# Patient Record
Sex: Female | Born: 1960
Health system: Southern US, Community
[De-identification: ages and names within clinical notes are randomized; demographics above are authoritative.]

## PROBLEM LIST (undated history)

## (undated) DIAGNOSIS — I341 Nonrheumatic mitral (valve) prolapse: Secondary | ICD-10-CM

## (undated) DIAGNOSIS — M810 Age-related osteoporosis without current pathological fracture: Secondary | ICD-10-CM

## (undated) DIAGNOSIS — R42 Dizziness and giddiness: Secondary | ICD-10-CM

## (undated) DIAGNOSIS — R87619 Unspecified abnormal cytological findings in specimens from cervix uteri: Secondary | ICD-10-CM

## (undated) DIAGNOSIS — K219 Gastro-esophageal reflux disease without esophagitis: Secondary | ICD-10-CM

## (undated) HISTORY — DX: Unspecified abnormal cytological findings in specimens from cervix uteri: R87.619

## (undated) HISTORY — DX: Dizziness and giddiness: R42

## (undated) HISTORY — DX: Gastro-esophageal reflux disease without esophagitis: K21.9

## (undated) HISTORY — DX: Nonrheumatic mitral (valve) prolapse: I34.1

## (undated) HISTORY — DX: Age-related osteoporosis without current pathological fracture: M81.0

---

## 1996-07-04 DIAGNOSIS — R87619 Unspecified abnormal cytological findings in specimens from cervix uteri: Secondary | ICD-10-CM

## 1996-07-04 HISTORY — DX: Unspecified abnormal cytological findings in specimens from cervix uteri: R87.619

## 1996-07-04 HISTORY — PX: COLPOSCOPY: SHX161

## 1996-07-04 HISTORY — PX: CERVICAL BIOPSY  W/ LOOP ELECTRODE EXCISION: SUR135

## 1998-03-03 ENCOUNTER — Other Ambulatory Visit: Admission: RE | Admit: 1998-03-03 | Discharge: 1998-03-03 | Payer: Self-pay | Admitting: Obstetrics and Gynecology

## 1998-07-18 ENCOUNTER — Other Ambulatory Visit: Admission: RE | Admit: 1998-07-18 | Discharge: 1998-07-18 | Payer: Self-pay | Admitting: Obstetrics and Gynecology

## 1999-01-16 ENCOUNTER — Other Ambulatory Visit: Admission: RE | Admit: 1999-01-16 | Discharge: 1999-01-16 | Payer: Self-pay | Admitting: Obstetrics and Gynecology

## 1999-12-25 ENCOUNTER — Other Ambulatory Visit: Admission: RE | Admit: 1999-12-25 | Discharge: 1999-12-25 | Payer: Self-pay | Admitting: Obstetrics and Gynecology

## 2000-06-21 ENCOUNTER — Other Ambulatory Visit: Admission: RE | Admit: 2000-06-21 | Discharge: 2000-06-21 | Payer: Self-pay | Admitting: Obstetrics and Gynecology

## 2000-12-20 ENCOUNTER — Encounter: Admission: RE | Admit: 2000-12-20 | Discharge: 2000-12-20 | Payer: Self-pay | Admitting: Family Medicine

## 2000-12-20 ENCOUNTER — Encounter: Payer: Self-pay | Admitting: Family Medicine

## 2000-12-25 ENCOUNTER — Other Ambulatory Visit: Admission: RE | Admit: 2000-12-25 | Discharge: 2000-12-25 | Payer: Self-pay | Admitting: Obstetrics and Gynecology

## 2001-10-08 ENCOUNTER — Other Ambulatory Visit: Admission: RE | Admit: 2001-10-08 | Discharge: 2001-10-08 | Payer: Self-pay | Admitting: Obstetrics and Gynecology

## 2002-02-17 ENCOUNTER — Other Ambulatory Visit: Admission: RE | Admit: 2002-02-17 | Discharge: 2002-02-17 | Payer: Self-pay | Admitting: *Deleted

## 2002-08-25 ENCOUNTER — Other Ambulatory Visit: Admission: RE | Admit: 2002-08-25 | Discharge: 2002-08-25 | Payer: Self-pay | Admitting: Obstetrics and Gynecology

## 2002-11-04 ENCOUNTER — Encounter: Admission: RE | Admit: 2002-11-04 | Discharge: 2002-11-04 | Payer: Self-pay | Admitting: Obstetrics and Gynecology

## 2002-11-04 ENCOUNTER — Encounter: Payer: Self-pay | Admitting: Obstetrics and Gynecology

## 2003-02-02 HISTORY — PX: HYSTEROSCOPY: SHX211

## 2003-03-02 ENCOUNTER — Ambulatory Visit (HOSPITAL_COMMUNITY): Admission: RE | Admit: 2003-03-02 | Discharge: 2003-03-02 | Payer: Self-pay | Admitting: Obstetrics and Gynecology

## 2003-03-02 ENCOUNTER — Encounter (INDEPENDENT_AMBULATORY_CARE_PROVIDER_SITE_OTHER): Payer: Self-pay | Admitting: *Deleted

## 2003-06-14 ENCOUNTER — Other Ambulatory Visit: Admission: RE | Admit: 2003-06-14 | Discharge: 2003-06-14 | Payer: Self-pay | Admitting: Obstetrics and Gynecology

## 2003-10-11 ENCOUNTER — Observation Stay (HOSPITAL_COMMUNITY): Admission: EM | Admit: 2003-10-11 | Discharge: 2003-10-12 | Payer: Self-pay | Admitting: Emergency Medicine

## 2004-05-25 ENCOUNTER — Encounter: Admission: RE | Admit: 2004-05-25 | Discharge: 2004-05-25 | Payer: Self-pay | Admitting: Obstetrics and Gynecology

## 2004-06-15 ENCOUNTER — Other Ambulatory Visit: Admission: RE | Admit: 2004-06-15 | Discharge: 2004-06-15 | Payer: Self-pay | Admitting: Obstetrics and Gynecology

## 2005-06-18 ENCOUNTER — Other Ambulatory Visit: Admission: RE | Admit: 2005-06-18 | Discharge: 2005-06-18 | Payer: Self-pay | Admitting: Obstetrics and Gynecology

## 2005-08-20 ENCOUNTER — Encounter: Admission: RE | Admit: 2005-08-20 | Discharge: 2005-08-20 | Payer: Self-pay | Admitting: Obstetrics and Gynecology

## 2006-07-18 ENCOUNTER — Other Ambulatory Visit: Admission: RE | Admit: 2006-07-18 | Discharge: 2006-07-18 | Payer: Self-pay | Admitting: Obstetrics & Gynecology

## 2006-08-18 ENCOUNTER — Encounter: Admission: RE | Admit: 2006-08-18 | Discharge: 2006-08-18 | Payer: Self-pay | Admitting: Specialist

## 2006-08-22 ENCOUNTER — Encounter: Admission: RE | Admit: 2006-08-22 | Discharge: 2006-08-22 | Payer: Self-pay | Admitting: Obstetrics and Gynecology

## 2007-08-04 ENCOUNTER — Other Ambulatory Visit: Admission: RE | Admit: 2007-08-04 | Discharge: 2007-08-04 | Payer: Self-pay | Admitting: Obstetrics & Gynecology

## 2008-04-28 ENCOUNTER — Encounter: Admission: RE | Admit: 2008-04-28 | Discharge: 2008-04-28 | Payer: Self-pay | Admitting: Obstetrics and Gynecology

## 2008-08-23 ENCOUNTER — Other Ambulatory Visit: Admission: RE | Admit: 2008-08-23 | Discharge: 2008-08-23 | Payer: Self-pay | Admitting: Obstetrics & Gynecology

## 2009-09-23 ENCOUNTER — Encounter: Admission: RE | Admit: 2009-09-23 | Discharge: 2009-09-23 | Payer: Self-pay | Admitting: Obstetrics and Gynecology

## 2010-01-24 ENCOUNTER — Observation Stay: Payer: Self-pay | Admitting: Internal Medicine

## 2010-02-06 ENCOUNTER — Ambulatory Visit: Payer: Self-pay | Admitting: Internal Medicine

## 2010-02-09 ENCOUNTER — Ambulatory Visit: Payer: Self-pay | Admitting: Internal Medicine

## 2010-02-12 ENCOUNTER — Emergency Department: Payer: Self-pay | Admitting: Emergency Medicine

## 2010-02-17 ENCOUNTER — Ambulatory Visit: Payer: Self-pay | Admitting: Unknown Physician Specialty

## 2010-10-26 ENCOUNTER — Other Ambulatory Visit: Payer: Self-pay | Admitting: Obstetrics and Gynecology

## 2010-10-26 DIAGNOSIS — Z1231 Encounter for screening mammogram for malignant neoplasm of breast: Secondary | ICD-10-CM

## 2010-11-06 ENCOUNTER — Ambulatory Visit
Admission: RE | Admit: 2010-11-06 | Discharge: 2010-11-06 | Disposition: A | Discharge status: Home / Self Care | Payer: Self-pay | Source: Ambulatory Visit | Attending: Obstetrics and Gynecology | Admitting: Obstetrics and Gynecology

## 2010-11-06 DIAGNOSIS — Z1231 Encounter for screening mammogram for malignant neoplasm of breast: Secondary | ICD-10-CM

## 2011-01-19 NOTE — Discharge Summary (Signed)
NAME:  Madison Evans, Madison Evans                          ACCOUNT NO.:  [id number]   MEDICAL RECORD NO.:  [id number]                   PATIENT TYPE:  OBV   LOCATION:  6529                                 FACILITY:  [facility]   PHYSICIAN:  Nicki Guadalajara, M.D.                  DATE OF BIRTH:  1961-06-19   DATE OF ADMISSION:  10/11/2003  DATE OF DISCHARGE:  10/12/2003                                 DISCHARGE SUMMARY   ADMISSION DIAGNOSES:  1. Atypical chest pain.  2. Possible sleep apnea.   BRIEF HISTORY:  The patient is a 50 year old white female with no prior  cardiac history and no past medical history who presents with left arm  discomfort.  She was initially seen by Dr. Talmadge Coventry and was  transferred to the ER and evaluated by Dr. Tresa Endo.  She notes that she was  sitting at her desk at work, she had no diaphoresis but developed some left  arm numbness and cold tingling feelings.  She felt presyncopal and light-  headed and had some mild shortness of breath.  There was no chest  discomfort, just some mild shortness of breath.  She was transferred to the  ER and EKG there showed sinus bradycardia, there was left axis deviation and  Q waves in V1 through 3.  There were no other STT wave changes.  Point of  care markers were obtained and these were all negative.  She was admitted  for further evaluation.  Troponins and CKs were also negative.  The  following morning, she had had no further chest pain, no shortness of  breath, left arm numbness had improved.   Her labs showed a hemoglobin of 14.2 and hematocrit of 41.5, white count 6.7  and platelets 227,000.  Labs showed a sodium of 138, potassium 3.4, chloride  104, CO2 29, BUN 10, creatinine 0.7, glucose 90.   She was seen and evaluated by Dr. Tresa Endo the following morning.  It was his  impression that she probably had a form of atypical chest pain.  A cervical  series has been obtained but we do not have that report yet and we are going  to check on it.  Dr. Tresa Endo plans to discharge the patient home today.  We  have set her up for a Cardiolite stress test and 2-D echo on Tuesday,  October 19, 2003 at 12:45 a.m.  The patient also noted that she had sleep  apnea and this was confirmed by her husband with longer periods of sleep  apnea noted at home.  Because of this, she is also being scheduled for a  sleep apnea study.  She will return to see Dr. Tresa Endo about 1 week after her  Cardiolite and 2-D echo are completed.  She was given Aleve to use as  directed for pain and we placed her on an 81 mg baby aspirin daily until  she  returns.   CONDITION ON DISCHARGE:  Improved.   DISCHARGE DIAGNOSES:  1. Atypical angina.  2. Possible sleep apnea.      Eber Hong, P.A.                 Nicki Guadalajara, M.D.    WDJ/MEDQ  D:  10/12/2003  T:  10/12/2003  Job:  [id number]   cc:   Talmadge Coventry, M.D.  [address]  [place]  [place] [address]  Fax: [phone]

## 2011-01-19 NOTE — Op Note (Signed)
NAME:  SAMANTHAN, DUGO                          ACCOUNT NO.:  [id number]   MEDICAL RECORD NO.:  [id number]                   PATIENT TYPE:  AMB   LOCATION:  SDC                                  FACILITY:  [facility]   PHYSICIAN:  Laqueta Linden, M.D.                 DATE OF BIRTH:  [birth year]   DATE OF PROCEDURE:  03/02/2003  DATE OF DISCHARGE:                                 OPERATIVE REPORT   PREOPERATIVE DIAGNOSIS:  Breakthrough bleeding, endocervical polyp.   POSTOPERATIVE DIAGNOSIS:  Breakthrough bleeding, endocervical polyp.   OPERATION PERFORMED:  Hysteroscopic resection of endocervical polyp,  endometrial curettage.   SURGEON:  Laqueta Linden, M.D.   ANESTHESIA:  MAC sedation with paracervical block.   ESTIMATED BLOOD LOSS:  Less than 50mL.   SORBITOL INTAKE:  .   COMPLICATIONS:  None.   INDICATIONS FOR PROCEDURE:  The patient is a 50 year old gravida 2, para 2,  married white female on birth control pills to control her cycles and acne.  She had a new onset of breakthrough bleeding for the past three cycles  associated with worsening dysmenorrhea.  Pelvic ultrasound was within normal  limits with no significant findings in the endometrial cavity on  sonohysterogram; however, there did appear to be a 1.1 cm  cervical/endocervical polyp which was felt to likely be responsible for her  symptoms.  She has seen the informed consent film, voiced her understanding  and acceptance of risks, benefits, alternatives and complications of the  hysteroscopic evaluation and resection and agrees to proceed.  She  understands that this might not provide a definitive solution for all of her  problems and that she will likely need to continue on hormonal control for  cycle regulation.  She has voiced her understanding and acceptance of all  risks and agrees to proceed.   DESCRIPTION OF PROCEDURE:  The patient was taken to the operating room and  after proper identification and consent  were ascertained, she was placed on  the operating table in supine position.  After IV sedation was accomplished,  she was placed in the [facility] stirrups and the perineum and vagina were  prepped and draped in a routine sterile fashion.  A transurethral Foley  catheter was placed which was removed at the conclusion of the procedure.  Bimanual examination confirmed an anterior, mobile, parous size uterus.  Speculum was placed in the vagina and the cervix was grasped with a single  toothed tenaculum.  A paracervical block utilizing 10mL of 1% plain  lidocaine was then placed circumferentially.  The endometrial cavity sounded  to 10 cm.  The internal os was gently dilated to a #33 Pratt dilator.  The  resectoscope with continuous Sorbitol infusion was then inserted under  direct vision.  The uterine fundus was free of lesions.  It was slightly  shaggy in appearance consistent with the patient's current bleeding state  but there were no focal lesions identified.  Upon backing up the scope, the  lower uterine segment appeared free of lesions as well.  Upon removing the  scope almost  to the level of the external os, a large broad based polyp  popped into view which appeared to be emanating from the left lateral aspect  of the cervix and endocervix.  The single loop was placed on routine  settings and this was resected in multiple passes.  Hemostasis was  excellent.  There were no other focal lesions identified.  Photographs were  taken.  The hysteroscope was then removed.  Sharp curettage of the  endometrial cavity revealed a minimal amount of additional tissue which was  sent separately.  All instruments were removed.  The tenaculum site was  hemostatic.  The estimated blood loss was less than 50mL.  Sorbitol net  intake .   COMPLICATIONS:  None.   The patient was stable and awake on transfer to the recovery room.  She  received Toradol 30 mg IV and 30 mg IM.  She was told to take Advil  or Aleve  as needed at home for cramping.  She is to finish her birth control pills as  directed and then resume cyclic birth control pill use.  She is to follow up  in the office in two to three weeks' time and see me for excessive pain,  fever, bleeding or other concerns.  She will be discharged per anesthesia  protocol.                                               Laqueta Linden, M.D.    LKS/MEDQ  D:  03/02/2003  T:  03/02/2003  Job:  [id number]

## 2011-09-03 ENCOUNTER — Emergency Department: Payer: Self-pay | Admitting: Emergency Medicine

## 2012-01-16 ENCOUNTER — Ambulatory Visit: Payer: Self-pay | Admitting: Obstetrics & Gynecology

## 2012-10-23 LAB — HM PAP SMEAR: HM Pap smear: NEGATIVE

## 2012-12-01 ENCOUNTER — Emergency Department: Payer: Self-pay | Admitting: Emergency Medicine

## 2012-12-01 LAB — CBC
MCV: 102 fL — ABNORMAL HIGH (ref 80–100)
RDW: 12.8 % (ref 11.5–14.5)

## 2012-12-01 LAB — BASIC METABOLIC PANEL
Creatinine: 0.71 mg/dL (ref 0.60–1.30)
EGFR (African American): >60
EGFR (Non-African Amer.): >60
Osmolality: 279 (ref 275–301)
Potassium: 3.9 mmol/L (ref 3.5–5.1)

## 2013-01-22 ENCOUNTER — Ambulatory Visit: Payer: Self-pay | Admitting: Obstetrics and Gynecology

## 2013-10-19 ENCOUNTER — Telehealth: Payer: Self-pay | Admitting: Nurse Practitioner

## 2013-10-19 NOTE — Telephone Encounter (Signed)
°  Patient needs refill of gildess fe.  Pharm[facility] [phone]

## 2013-10-20 MED ORDER — NORETHIN ACE-ETH ESTRAD-FE 1-20 MG-MCG PO TABS: 1.0000 | ORAL_TABLET | Freq: Every day | ORAL | Status: DC

## 2013-10-20 NOTE — Telephone Encounter (Signed)
Last refilled: AEX 10/23/12 #90/3 refills  Current AEX: 10/26/13 with PG  Gildess FE 1/20 #1 Pack with no refills sent to pharmacy  Patient is aware.  Please Advise.  Routed to provider, encounter closed.

## 2013-10-23 ENCOUNTER — Encounter: Payer: Self-pay | Admitting: Nurse Practitioner

## 2013-10-26 ENCOUNTER — Encounter: Payer: Self-pay | Admitting: Nurse Practitioner

## 2013-10-26 ENCOUNTER — Ambulatory Visit (INDEPENDENT_AMBULATORY_CARE_PROVIDER_SITE_OTHER): Payer: BC Managed Care – PPO | Admitting: Nurse Practitioner

## 2013-10-26 VITALS — BP 110/66 | HR 68 | Ht 63.0 in | Wt 144.0 lb

## 2013-10-26 DIAGNOSIS — Z8742 Personal history of other diseases of the female genital tract: Secondary | ICD-10-CM

## 2013-10-26 DIAGNOSIS — N3941 Urge incontinence: Secondary | ICD-10-CM

## 2013-10-26 DIAGNOSIS — Z0000 Encounter for general adult medical examination without abnormal findings: Secondary | ICD-10-CM

## 2013-10-26 DIAGNOSIS — Z01419 Encounter for gynecological examination (general) (routine) without abnormal findings: Secondary | ICD-10-CM

## 2013-10-26 DIAGNOSIS — Z23 Encounter for immunization: Secondary | ICD-10-CM

## 2013-10-26 LAB — POCT URINALYSIS DIPSTICK
Bilirubin, UA: NEGATIVE
Blood, UA: NEGATIVE
Glucose, UA: NEGATIVE
KETONES UA: NEGATIVE
LEUKOCYTES UA: NEGATIVE
NITRITE UA: NEGATIVE
PH UA: 8
PROTEIN UA: NEGATIVE
UROBILINOGEN UA: NEGATIVE

## 2013-10-26 LAB — VITAMIN B12: Vitamin B-12: 754 pg/mL (ref 211–911)

## 2013-10-26 LAB — HEMOGLOBIN, FINGERSTICK: Hemoglobin, fingerstick: 14.2 g/dL (ref 12.0–16.0)

## 2013-10-26 MED ORDER — NORETHIN ACE-ETH ESTRAD-FE 1-20 MG-MCG PO TABS: 1.0000 | ORAL_TABLET | Freq: Every day | ORAL | Status: DC

## 2013-10-26 NOTE — Patient Instructions (Addendum)

## 2013-10-26 NOTE — Progress Notes (Signed)
Patient ID: Madison Evans, female   DOB: [birth year], 53 y.o.   MRN: [id number] 53 y.o. G58P2002 Married Caucasian Fe here for annual exam.  Menses usually every month with spotting for 2-3 days. No vaso symptoms. Has a new job in [employer].  She does have occasions of urge incontinence that seem to be more noticeable now that she drive 30 minutes to work.  Her daughter and son in law have now adopted a little black girl in December.  Their infertility evaluations and invitro ended with miscarriage.  She is having bilateral feet pain and temperature changes.  She is followed by podiatrist and wants to get Vit B 12 level.  Rest of labs is done yearly in June   Patient's last menstrual period was 10/20/2013.          Sexually active: yes  The current method of family planning is vasectomy and OCP (estrogen/progesterone).    Exercising: yes  Home/gym routine that includes some weights and cardio.  Ran a 1/2 marathon last year. Smoker:  no  Health Maintenance: Pap:  10/23/12, WNL, neg HR HPV MMG:  01/22/13, Bi-Rads 2: benign findings Colonoscopy:  01/2012, repeat in 10 years BMD:  never TDaP:  2004? Labs:  HB: 14.2 Urine:  Negative   reports that she has never smoked. She has never used smokeless tobacco. She reports that she drinks alcohol. She reports that she does not use illicit drugs.  Past Medical History  Diagnosis Date  . MVP (mitral valve prolapse)   . Abnormal Pap smear of cervix 11/97    LEEP, CIN III  . Vertigo     Past Surgical History  Procedure Laterality Date  . Colposcopy  11/97    CIN III  . Cervical biopsy  w/ loop electrode excision  11/97    CIN III  . Hysteroscopy  02/2003    benign polyps    Current Outpatient Prescriptions  Medication Sig Dispense Refill  . meloxicam (MOBIC) 15 MG tablet Take 1 tablet by mouth daily.      . norethindrone-ethinyl estradiol (GILDESS FE 1/20) 1-20 MG-MCG tablet Take 1 tablet by mouth daily.  3 Package  3   No current  facility-administered medications for this visit.    Family History  Problem Relation Age of Onset  . Osteoporosis Mother   . Osteoporosis Maternal Grandmother   . Diabetes Sister     ROS:  Pertinent items are noted in HPI.  Otherwise, a comprehensive ROS was negative.  Exam:   BP 110/66  Pulse 68  Ht 5\' 3"  (1.6 m)  Wt 144 lb (65.318 kg)  BMI 25.51 kg/m2  LMP 10/20/2013 Height: 5\' 3"  (160 cm)  Ht Readings from Last 3 Encounters:  10/26/13 5\' 3"  (1.6 m)    General appearance: alert, cooperative and appears stated age Head: Normocephalic, without obvious abnormality, atraumatic Neck: no adenopathy, supple, symmetrical, trachea midline and thyroid normal to inspection and palpation Lungs: clear to auscultation bilaterally Breasts: normal appearance, no masses or tenderness Heart: regular rate and rhythm Abdomen: soft, non-tender; no masses,  no organomegaly Extremities: extremities normal, atraumatic, no cyanosis or edema Skin: Skin color, texture, turgor normal. No rashes or lesions Lymph nodes: Cervical, supraclavicular, and axillary nodes normal. No abnormal inguinal nodes palpated Neurologic: Grossly normal   Pelvic: External genitalia:  no lesions              Urethra:  normal appearing urethra with no masses, tenderness or lesions  Bartholin's and Skene's: normal                 Vagina: normal appearing vagina with normal color and discharge, no lesions              Cervix: anteverted              Pap taken: yes Bimanual Exam:  Uterus:  normal size, contour, position, consistency, mobility, non-tender              Adnexa: no mass, fullness, tenderness               Rectovaginal: Confirms               Anus:  normal sphincter tone, no lesions  A:  Well Woman with normal exam  History of irregular menses and on OCP for cycle regulation.  History of CIN III with LEEP 11/97 (pap X 20 yrs)  Update immunization  Urge incontinence  Bilateral feet  pain  P:   Pap smear as per guidelines Done today  Tdap given today.  Mammogram due 01/2014  Refill OCP Gildess for a year with plans to DC next year   Check Vit B 12 secondary to bilateral feet pain  She will try OTC Detrol patch for urinary issues and start with Kegel exercises.  If this is not helpful will call back for further evaluation with Dr. Edward JollySilva.  Counseled on breast self exam, mammography screening, use and side effects of OCP's, adequate intake of calcium and vitamin D, diet and exercise, Kegel's exercises return annually or prn  An After Visit Summary was printed and given to the patient.

## 2013-10-28 ENCOUNTER — Telehealth: Payer: Self-pay | Admitting: *Deleted

## 2013-10-28 LAB — IPS PAP TEST WITH REFLEX TO HPV

## 2013-10-28 NOTE — Telephone Encounter (Signed)
Message copied by Luisa DagoPHILLIPS, STEPHANIE C on Wed Oct 28, 2013 10:13 AM ------      Message from: Ria CommentGRUBB, PATRICIA R      Created: Tue Oct 27, 2013 10:53 AM       Let her know that Vit B 12 is normal and not the cause of her bilateral feet coldness or pain. ------

## 2013-10-28 NOTE — Telephone Encounter (Signed)
I have attempted to contact this patient by phone with the following results: left message to return my call on answering machine (mobile).  

## 2013-10-28 NOTE — Telephone Encounter (Signed)
Pt notified in result note.  

## 2013-10-29 NOTE — Progress Notes (Signed)
Encounter reviewed by Dr. Brook Silva.  

## 2014-03-04 ENCOUNTER — Ambulatory Visit: Payer: Self-pay | Admitting: Obstetrics and Gynecology

## 2014-07-05 ENCOUNTER — Encounter: Payer: Self-pay | Admitting: Nurse Practitioner

## 2014-09-16 ENCOUNTER — Telehealth: Payer: Self-pay | Admitting: Nurse Practitioner

## 2014-09-16 NOTE — Telephone Encounter (Signed)
Left message regarding upcoming appointment has been canceled and needs to be rescheduled. °

## 2014-10-01 DIAGNOSIS — Z8669 Personal history of other diseases of the nervous system and sense organs: Secondary | ICD-10-CM | POA: Insufficient documentation

## 2014-10-15 ENCOUNTER — Other Ambulatory Visit: Payer: Self-pay | Admitting: Nurse Practitioner

## 2014-10-18 ENCOUNTER — Encounter: Payer: Self-pay | Admitting: Nurse Practitioner

## 2014-10-18 NOTE — Telephone Encounter (Signed)
She has apt next week.  At this time we were going to stop OCP secondary to age.  Will need to discuss this with her at that time.

## 2014-10-18 NOTE — Telephone Encounter (Signed)
Medication refill request: Gildess Fe 1/20 Last AEX:  10/26/13 Next AEX: 10/27/14 Last MMG (if hormonal medication request): 03/2014 BIRADS1:Neg Refill authorized: 10/26/13 #3packs/3R. Today #3pack/0R?

## 2014-10-19 ENCOUNTER — Other Ambulatory Visit: Payer: Self-pay | Admitting: Nurse Practitioner

## 2014-10-19 MED ORDER — NORETHIN ACE-ETH ESTRAD-FE 1-20 MG-MCG PO TABS: 1.0000 | ORAL_TABLET | Freq: Every day | ORAL | Status: DC

## 2014-10-27 ENCOUNTER — Encounter: Payer: Self-pay | Admitting: Nurse Practitioner

## 2014-10-27 ENCOUNTER — Ambulatory Visit (INDEPENDENT_AMBULATORY_CARE_PROVIDER_SITE_OTHER): Payer: BLUE CROSS/BLUE SHIELD | Admitting: Nurse Practitioner

## 2014-10-27 VITALS — BP 100/60 | HR 64 | Resp 18 | Ht 62.75 in | Wt 147.0 lb

## 2014-10-27 DIAGNOSIS — Z01419 Encounter for gynecological examination (general) (routine) without abnormal findings: Secondary | ICD-10-CM

## 2014-10-27 DIAGNOSIS — N951 Menopausal and female climacteric states: Secondary | ICD-10-CM

## 2014-10-27 DIAGNOSIS — Z0000 Encounter for general adult medical examination without abnormal findings: Secondary | ICD-10-CM

## 2014-10-27 LAB — POCT URINALYSIS DIPSTICK
BILIRUBIN UA: NEGATIVE
GLUCOSE UA: NEGATIVE
Ketones, UA: NEGATIVE
Leukocytes, UA: NEGATIVE
NITRITE UA: NEGATIVE
Protein, UA: NEGATIVE
RBC UA: NEGATIVE
Urobilinogen, UA: NEGATIVE
pH, UA: 5

## 2014-10-27 NOTE — Patient Instructions (Addendum)
EXERCISE AND DIET:  We recommended that you start or continue a regular exercise program for good health. Regular exercise means any activity that makes your heart beat faster and makes you sweat.  We recommend exercising at least 30 minutes per day at least 3 days a week, preferably 4 or 5.  We also recommend a diet low in fat and sugar.  Inactivity, poor dietary choices and obesity can cause diabetes, heart attack, stroke, and kidney damage, among others.    ALCOHOL AND SMOKING:  Women should limit their alcohol intake to no more than 7 drinks/beers/glasses of wine (combined, not each!) per week. Moderation of alcohol intake to this level decreases your risk of breast cancer and liver damage. And of course, no recreational drugs are part of a healthy lifestyle.  And absolutely no smoking or even second hand smoke. Most people know smoking can cause heart and lung diseases, but did you know it also contributes to weakening of your bones? Aging of your skin?  Yellowing of your teeth and nails?  CALCIUM AND VITAMIN D:  Adequate intake of calcium and Vitamin D are recommended.  The recommendations for exact amounts of these supplements seem to change often, but generally speaking 600 mg of calcium (either carbonate or citrate) and 800 units of Vitamin D per day seems prudent. Certain women may benefit from higher intake of Vitamin D.  If you are among these women, your doctor will have told you during your visit.    PAP SMEARS:  Pap smears, to check for cervical cancer or precancers,  have traditionally been done yearly, although recent scientific advances have shown that most women can have pap smears less often.  However, every woman still should have a physical exam from her gynecologist every year. It will include a breast check, inspection of the vulva and vagina to check for abnormal growths or skin changes, a visual exam of the cervix, and then an exam to evaluate the size and shape of the uterus and  ovaries.  And after 53 years of age, a rectal exam is indicated to check for rectal cancers. We will also provide age appropriate advice regarding health maintenance, like when you should have certain vaccines, screening for sexually transmitted diseases, bone density testing, colonoscopy, mammograms, etc.   MAMMOGRAMS:  All women over 40 years old should have a yearly mammogram. Many facilities now offer a "3D" mammogram, which may cost around $50 extra out of pocket. If possible,  we recommend you accept the option to have the 3D mammogram performed.  It both reduces the number of women who will be called back for extra views which then turn out to be normal, and it is better than the routine mammogram at detecting truly abnormal areas.    COLONOSCOPY:  Colonoscopy to screen for colon cancer is recommended for all women at age 50.  We know, you hate the idea of the prep.  We agree, BUT, having colon cancer and not knowing it is worse!!  Colon cancer so often starts as a polyp that can be seen and removed at colonscopy, which can quite literally save your life!  And if your first colonoscopy is normal and you have no family history of colon cancer, most women don't have to have it again for 10 years.  Once every ten years, you can do something that may end up saving your life, right?  We will be happy to help you get it scheduled when you are ready.    Be sure to check your insurance coverage so you understand how much it will cost.  It may be covered as a preventative service at no cost, but you should check your particular policy.    Plan is to go off birth control pill after this pack.  Then you will note any vaginal bleeding on a calendar - if persistent bleeding or prolonged bleeding to call back.  You will get a hormone test and check the levels to see if menopausal.  This test can be done on the same day as consult with Dr, Edward JollySilva.

## 2014-10-27 NOTE — Progress Notes (Signed)
54 y.o. 122P2002 Married Caucasian Fe here for annual exam.  She has been on OCP for cycle regulation over the past several years.  She is somewhat concerned about coming off with concerns of acne flares and mood changes.  She has very little vaso symptoms.  She normally gets a 1/2 day of spotting that does not require a tampon.  Only this past month in January did she have 1 day menses with use of a tampon.  She is also having increase in stress/ urge incontinence.  When she re-established care with PCP in January he gave her RX of Vesicare - cost was too high and did not get.  She would like other evaluation for treatment options.  His notes and labs are on Care Everywhere.  She did have FSH done in 2011 and at that time [facility].    Patient's last menstrual period was 09/21/2014.          Sexually active: Yes.    The current method of family planning is OCP (estrogen/progesterone).    Exercising: Yes.    Strength training, walking Smoker:  no  Health Maintenance: Pap:  10/26/13 Neg  (history of CIN III 07/1996) MMG:  03/04/14 BIRADS1:neg Colonoscopy: 2013 normal, recheck in 10 years TDaP: 2015 Labs: PCP UA: clear   reports that she has never smoked. She has never used smokeless tobacco. She reports that she drinks about 0.6 oz of alcohol per week. She reports that she does not use illicit drugs.  Past Medical History  Diagnosis Date  . MVP (mitral valve prolapse)   . Abnormal Pap smear of cervix 11/97    LEEP, CIN III  . Vertigo     Past Surgical History  Procedure Laterality Date  . Colposcopy  11/97    CIN III  . Cervical biopsy  w/ loop electrode excision  11/97    CIN III  . Hysteroscopy  02/2003    benign polyps    Current Outpatient Prescriptions  Medication Sig Dispense Refill  . aspirin EC 81 MG tablet Take by mouth.    . Multiple Vitamin (MULTI-VITAMINS) TABS Take by mouth.    . norethindrone-ethinyl estradiol (GILDESS FE 1/20) 1-20 MG-MCG tablet Take 1 tablet by mouth  daily. 1 Package 0   No current facility-administered medications for this visit.    Family History  Problem Relation Age of Onset  . Osteoporosis Mother   . Osteoporosis Maternal Grandmother   . Diabetes Sister     ROS:  Pertinent items are noted in HPI.  Otherwise, a comprehensive ROS was negative.  Exam:   BP 100/60 mmHg  Pulse 64  Resp 18  Ht 5' 2.75" (1.594 m)  Wt 147 lb (66.679 kg)  BMI 26.24 kg/m2  LMP 09/21/2014 Height: 5' 2.75" (159.4 cm) Ht Readings from Last 3 Encounters:  10/27/14 5' 2.75" (1.594 m)  10/26/13 5\' 3"  (1.6 m)    General appearance: alert, cooperative and appears stated age Head: Normocephalic, without obvious abnormality, atraumatic Neck: no adenopathy, supple, symmetrical, trachea midline and thyroid normal to inspection and palpation Lungs: clear to auscultation bilaterally Breasts: normal appearance, no masses or tenderness Heart: regular rate and rhythm Abdomen: soft, non-tender; no masses,  no organomegaly Extremities: extremities normal, atraumatic, no cyanosis or edema Skin: Skin color, texture, turgor normal. No rashes or lesions Lymph nodes: Cervical, supraclavicular, and axillary nodes normal. No abnormal inguinal nodes palpated Neurologic: Grossly normal   Pelvic: External genitalia:  no lesions  Urethra:  normal appearing urethra with no masses, tenderness or lesions              Bartholin's and Skene's: normal                 Vagina: normal appearing vagina with normal color and discharge, no lesions              Cervix: anteverted              Pap taken: Yes.   Bimanual Exam:  Uterus:  normal size, contour, position, consistency, mobility, non-tender              Adnexa: no mass, fullness, tenderness               Rectovaginal: Confirms               Anus:  normal sphincter tone, no lesions  Chaperone present: Yes  A:  Well Woman with normal exam  History of irregular menses and on OCP for cycle  regulation. History of CIN III with LEEP 11/97 (pap X 20 yrs) Urge/ stress incontinence   P:   Reviewed health and wellness pertinent to exam  Pap smear taken today  Mammogram is due 7/16  After discussion we have decided to stop OCP after this pack.  She will then record any vaginal bleeding.  To call back if heavy, prolonged, or really any changes in menses.    Will try to get consult visit with Dr. Edward Jolly in 4-6 weeks to assess her bladder issues - at that time also recheck [facility] levels.  Not sure is she wants HRT and if really needed wants Bio identical.  Counseled on breast self exam, mammography screening, adequate intake of calcium and vitamin D, diet and exercise, Kegel's exercises return annually or prn  An After Visit Summary was printed and given to the patient.

## 2014-10-28 NOTE — Progress Notes (Signed)
Encounter reviewed by Dr. Graclyn Lawther Silva.  

## 2014-10-29 ENCOUNTER — Ambulatory Visit: Payer: BC Managed Care – PPO | Admitting: Nurse Practitioner

## 2014-10-29 LAB — IPS PAP TEST WITH HPV

## 2014-12-09 ENCOUNTER — Other Ambulatory Visit: Payer: Self-pay | Admitting: Nurse Practitioner

## 2014-12-09 ENCOUNTER — Ambulatory Visit (INDEPENDENT_AMBULATORY_CARE_PROVIDER_SITE_OTHER): Payer: BLUE CROSS/BLUE SHIELD | Admitting: Obstetrics and Gynecology

## 2014-12-09 ENCOUNTER — Encounter: Payer: Self-pay | Admitting: Obstetrics and Gynecology

## 2014-12-09 VITALS — BP 100/62 | HR 64 | Ht 62.75 in | Wt 145.8 lb

## 2014-12-09 DIAGNOSIS — N912 Amenorrhea, unspecified: Secondary | ICD-10-CM | POA: Diagnosis not present

## 2014-12-09 DIAGNOSIS — N951 Menopausal and female climacteric states: Secondary | ICD-10-CM | POA: Diagnosis not present

## 2014-12-09 DIAGNOSIS — N3946 Mixed incontinence: Secondary | ICD-10-CM

## 2014-12-09 NOTE — Progress Notes (Signed)
Patient ID: Madison Evans, female   DOB: 1960/09/06, 54 y.o.   MRN: [id number] GYNECOLOGY VISIT  PCP:   Aram BeechamJeffrey Sparks, MD  Referring provider:   HPI: 54 y.o.   Married  Caucasian  female   G2P2002 with Patient's last menstrual period was 09/21/2014 (exact date).   here for evaluation for stress/urge incontinence.    Feels wet all the time.  Feels constant leaking and is unsure if she leaks with cough, laugh, or sneeze.  Not doing heavy exercise due to knee issues.  Drives on hour to work. Can barely make it in to building at work when arrives.  Coffee at 5:00 am plus water.  Drinks 60 ounces water per day.  ETOH once or twice per month.   Denies dysuria.  Urine dip negative 10/27/14.   Does Kegels. Received Rx for Vesicare but did not fill due to cost.  No urologic procedures or evaluation to date.    No fecal incontinence.  BM q am.  No constipation unless changes diet.   Stopped birth control pills 1 month ago.   Some increase in acne. Some difficulty losing weight.  Patient states she also gets up about 3 times at night to urinate. [facility].  Husband has vasectomy.   Had a vaginal hematoma with birth of daughter.  Treated surgically.   Had testing for sleep apnea in 2006 or 2008. No treatment given.   GYNECOLOGIC HISTORY: Patient's last menstrual period was 09/21/2014 (exact date). Sexually active:  yes Partner preference: female partner--husband Contraception: vasectomy   Menopausal hormone therapy: n/a DES exposure: no Blood transfusions:  no  Sexually transmitted diseases: no  GYN procedures and prior surgeries:  Colposcopy/Leep procedure for CIN III 1997, Hysteroscopy/polypectomy Last mammogram: 03-04-14 WGN:FAO[facility]wnl:[facility]               Last pap and high risk HPV testing: 10-27-14  wnl:HR HPV  History of abnormal pap smear: yes, 1997 hx of colposcopy/LEEP for CIN III    OB History    Gravida Para Term Preterm AB TAB SAB Ectopic Multiple Living   2  2 2       2        Past Medical History  Diagnosis Date  . MVP (mitral valve prolapse)   . Abnormal Pap smear of cervix 11/97    LEEP, CIN III  . Vertigo     Past Surgical History  Procedure Laterality Date  . Colposcopy  11/97    CIN III  . Cervical biopsy  w/ loop electrode excision  11/97    CIN III  . Hysteroscopy  02/2003    benign polyps    Current Outpatient Prescriptions  Medication Sig Dispense Refill  . glucosamine-chondroitin 500-400 MG tablet Take 1 tablet by mouth daily.    Marland Kitchen. aspirin EC 81 MG tablet Take by mouth.    . Multiple Vitamin (MULTI-VITAMINS) TABS Take by mouth.     No current facility-administered medications for this visit.     ALLERGIES: Review of patient's allergies indicates no known allergies.  Family History  Problem Relation Age of Onset  . Osteoporosis Mother   . Osteoporosis Maternal Grandmother   . Diabetes Sister     History   Social History  . Marital Status: Married    Spouse Name: N/A  . Number of Children: 2  . Years of Education: N/A   Occupational History  . Not on file.   Social History Main Topics  .  Smoking status: Never Smoker   . Smokeless tobacco: Never Used  . Alcohol Use: 0.6 oz/week    1 Glasses of wine per week     Comment: occ  . Drug Use: No  . Sexual Activity:    Partners: Male    Birth Control/ Protection: Surgical     Comment: vasectomy   Other Topics Concern  . Not on file   Social History Narrative    ROS:  Pertinent items are noted in HPI.  PHYSICAL EXAMINATION:    BP 100/62 mmHg  Pulse 64  Ht 5' 2.75" (1.594 m)  Wt 145 lb 12.8 oz (66.134 kg)  BMI 26.03 kg/m2  LMP 09/21/2014 (Exact Date)   Wt Readings from Last 3 Encounters:  12/09/14 145 lb 12.8 oz (66.134 kg)  10/27/14 147 lb (66.679 kg)  10/26/13 144 lb (65.318 kg)     Ht Readings from Last 3 Encounters:  12/09/14 5' 2.75" (1.594 m)  10/27/14 5' 2.75" (1.594 m)  10/26/13  (1.6 m)    General appearance: alert,  cooperative and appears stated age   Abdomen: soft, non-tender; no masses,  no organomegaly   Pelvic: External genitalia:  no lesions              Urethra:  normal appearing urethra with no masses, tenderness or lesions              Bartholins and Skenes: normal                 Vagina: normal appearing vagina with normal color and discharge, no lesions.  Minimal cystocele rectocele.  Excellent Kegel.               Cervix: normal appearance                 Bimanual Exam:  Uterus:  uterus is normal size, shape, consistency and nontender                                      Adnexa: normal adnexa in size, nontender and no masses                                      Rectovaginal: Confirms                                      Anus:  normal sphincter tone, no lesions  ASSESSMENT  Urinary incontinence.  I suspect patient may have mixed incontinence but by history, this is very difficulty to discern.  Menopausal symptoms.   PLAN  I have had a comprehensive discussion with the patient regarding urinary incontinence.  I have provided reading materials from ACOG regarding incontinence in general as well as medical and surgical treatment for these conditions. Medical treatments may include physical therapy, anticholinergic/antimuscarinic therapy, and pessary use.   We discussed benefits and risks of TVT midurethral sling/cystoscopy surgery which include but are not limited to bleeding, infection, damage to surrounding organs, ureteral damage, vaginal pain with intercourse, mesh erosion and exposure, dyspareunia, urinary retention and need for prolonged catheterization and/or self catheterization, reoperation, recurrence of prolapse and incontinence,  DVT, PE, death, and reaction to anesthesia.    I recommend that the patient proceed with  multichannel urodynamic testing in order to create an appropriate plan of care.  Procedure explained.   Having FSH and estradiol checked today.  Will consider AMH  also in the future.  It is too late in the day for [facility] due to preparation of the blood for this test.  An After Visit Summary was printed and given to the patient.  25 minutes face to face time of which over 50% was spent in counseling.

## 2014-12-10 LAB — ESTRADIOL: Estradiol: <11.8 pg/mL

## 2014-12-10 LAB — FOLLICLE STIMULATING HORMONE: FSH: 88.2 m[IU]/mL

## 2014-12-13 ENCOUNTER — Telehealth: Payer: Self-pay | Admitting: Obstetrics and Gynecology

## 2014-12-13 NOTE — Telephone Encounter (Signed)
Left message for patient to call back. Need to go over benefits and schedule urodynamics

## 2014-12-16 ENCOUNTER — Encounter: Payer: Self-pay | Admitting: Obstetrics and Gynecology

## 2014-12-16 NOTE — Telephone Encounter (Signed)
Please contact the patient back with the following recommendations.   Options for care instead of doing the urodynamic testing are: 1.  Ditropan XL 10 mg daily to treat overactive bladder.  #30, RF one.  Side effects are dry mouth, constipation and urinary retention.  Will need a follow up appointment in 6 weeks for a recheck.  This should not be an expensive drug.  2.  Physical [facility].  3.  Both!

## 2014-12-16 NOTE — Telephone Encounter (Signed)
Left message to call Kaitlyn at [phone]. 

## 2014-12-16 NOTE — Telephone Encounter (Signed)
Call to patient. Advised of benefit quote received for urodynamics testing. Patient states that she will think about it and call us back if she decides to proceed.

## 2014-12-21 NOTE — Telephone Encounter (Signed)
Left message to call Markus Casten at [phone]. 

## 2014-12-23 NOTE — Telephone Encounter (Signed)
Spoke with patient. Advised of alternatives listed below from Dr.Silva. Patient is agreeable. Patient would like to think about options and return call to discuss decision.

## 2014-12-23 NOTE — Telephone Encounter (Signed)
Thank you for speaking with the patient.  I have closed the encounter.

## 2015-01-11 ENCOUNTER — Telehealth: Payer: Self-pay | Admitting: Emergency Medicine

## 2015-01-11 ENCOUNTER — Encounter: Payer: Self-pay | Admitting: Nurse Practitioner

## 2015-01-11 NOTE — Telephone Encounter (Signed)
Telephone call for triage created to discuss message with patient and disposition as appropriate.   

## 2015-01-11 NOTE — Telephone Encounter (Signed)
Message left to return call to [place] at [phone].   Patient needs clinical triage, patient was thinking about scheduling Urodynamics testing to offer to schedule next available urodynamics (next available date 01/26/15, will need UA prior). Patient husband with vasectomy and FSH 88.2.

## 2015-01-11 NOTE — Telephone Encounter (Signed)
Chief Complaint  Patient presents with  . Advice Only    Patient sent mychart message-clinical triage needed       Non-Urgent Medical Question    01/11/2015 8:13 AM Reply  To: [place]    From: Madison Evans    Created: 01/11/2015 8:13 AM    Hi. I've been off the pill for about 2 months now after being on it for 20+ years. I am experiencing pretty constant mild cramping, significant bloating/distended stomach, breast tenderness, acne, and currently an egg white consistency discharge. I have faithfully been exercising every day (30 minutes strength/cardio, 20-30 minute brisk walk, stair climbing throughout the day). My diet hasn't changed significantly other than less bread and sweets. I am trying to lose weight and inches and nothing is working and instead, my clothes are tighter.   I actually went and took a pregnancy test Saturday (embarrassing!) because my stomach was so huge (and I actually looked pregnant). Is this the new normal or do I need to be concerned? On another note, I met with Dr. Quincy Simmonds about my bladder problems and she's given me a few things to consider (urodynamic study, Ditropan, and/or physical therapy. I'm leaning toward the study and will decide and contact them sometime next week to coordinate.

## 2015-01-12 NOTE — Telephone Encounter (Signed)
Patient returned call. She states she sent a message to provider directly because she did not know if symptoms as written in Ashermychart message were concerning. Patient states "I just feel like I am getting ready to start a period and it's not coming." No vaginal bleeding.  Patient denies abdominal pain or GI issues, states that bloating is main concern. Advised patient if symptoms are new and concerning to her, advised office visit recommended. Patient states she is traveling and working so multiple dates and times discussed and patient choose appointment on 01/27/15 for evaluation with Lauro FranklinPatricia Rolen-Grubb, FNP. Advised patient if any new or concerning symptoms develop or any concerns to please call back. Patient agreeable. Patient states she is still thinking about her options for care, she will call back to schedule Urodynamics if she chooses.   Routing to provider for final review. Patient agreeable to disposition. Patient aware provider will review message and nurse will return call with any additional instructions or change of disposition. Will close encounter.   Routing to Dr. Edward JollySilva as covering cc Lauro FranklinPatricia Rolen-Grubb, FNP

## 2015-01-27 ENCOUNTER — Encounter: Payer: Self-pay | Admitting: Nurse Practitioner

## 2015-01-27 ENCOUNTER — Ambulatory Visit (INDEPENDENT_AMBULATORY_CARE_PROVIDER_SITE_OTHER): Payer: BLUE CROSS/BLUE SHIELD | Admitting: Nurse Practitioner

## 2015-01-27 VITALS — BP 100/66 | HR 72 | Ht 62.75 in | Wt 146.0 lb

## 2015-01-27 DIAGNOSIS — R140 Abdominal distension (gaseous): Secondary | ICD-10-CM

## 2015-01-27 DIAGNOSIS — N644 Mastodynia: Secondary | ICD-10-CM | POA: Diagnosis not present

## 2015-01-27 DIAGNOSIS — L709 Acne, unspecified: Secondary | ICD-10-CM | POA: Diagnosis not present

## 2015-01-27 NOTE — Patient Instructions (Addendum)
Menopause Menopause is the normal time of life when menstrual periods stop completely. Menopause is complete when you have missed 12 consecutive menstrual periods. It usually occurs between the ages of 48 years and 55 years. Very rarely does a woman develop menopause before the age of 40 years. At menopause, your ovaries stop producing the female hormones estrogen and progesterone. This can cause undesirable symptoms and also affect your health. Sometimes the symptoms may occur 4-5 years before the menopause begins. There is no relationship between menopause and:  Oral contraceptives.  Number of children you had.  Race.  The age your menstrual periods started (menarche). Heavy smokers and very thin women may develop menopause earlier in life. CAUSES  The ovaries stop producing the female hormones estrogen and progesterone.  Other causes include:  Surgery to remove both ovaries.  The ovaries stop functioning for no known reason.  Tumors of the pituitary gland in the brain.  Medical disease that affects the ovaries and hormone production.  Radiation treatment to the abdomen or pelvis.  Chemotherapy that affects the ovaries. SYMPTOMS   Hot flashes.  Night sweats.  Decrease in sex drive.  Vaginal dryness and thinning of the vagina causing painful intercourse.  Dryness of the skin and developing wrinkles.  Headaches.  Tiredness.  Irritability.  Memory problems.  Weight gain.  Bladder infections.  Hair growth of the face and chest.  Infertility. More serious symptoms include:  Loss of bone (osteoporosis) causing breaks (fractures).  Depression.  Hardening and narrowing of the arteries (atherosclerosis) causing heart attacks and strokes. DIAGNOSIS   When the menstrual periods have stopped for 12 straight months.  Physical exam.  Hormone studies of the blood. TREATMENT  There are many treatment choices and nearly as many questions about them. The  decisions to treat or not to treat menopausal changes is an individual choice made with your health care provider. Your health care provider can discuss the treatments with you. Together, you can decide which treatment will work best for you. Your treatment choices may include:   Hormone therapy (estrogen and progesterone).  Non-hormonal medicines.  Treating the individual symptoms with medicine (for example antidepressants for depression).  Herbal medicines that may help specific symptoms.  Counseling by a psychiatrist or psychologist.  Group therapy.  Lifestyle changes including:  Eating healthy.  Regular exercise.  Limiting caffeine and alcohol.  Stress management and meditation.  No treatment. HOME CARE INSTRUCTIONS   Take the medicine your health care provider gives you as directed.  Get plenty of sleep and rest.  Exercise regularly.  Eat a diet that contains calcium (good for the bones) and soy products (acts like estrogen hormone).  Avoid alcoholic beverages.  Do not smoke.  If you have hot flashes, dress in layers.  Take supplements, calcium, and vitamin D to strengthen bones.  You can use over-the-counter lubricants or moisturizers for vaginal dryness.  Group therapy is sometimes very helpful.  Acupuncture may be helpful in some cases. SEEK MEDICAL CARE IF:   You are not sure you are in menopause.  You are having menopausal symptoms and need advice and treatment.  You are still having menstrual periods after age 55 years.  You have pain with intercourse.  Menopause is complete (no menstrual period for 12 months) and you develop vaginal bleeding.  You need a referral to a specialist (gynecologist, psychiatrist, or psychologist) for treatment. SEEK IMMEDIATE MEDICAL CARE IF:   You have severe depression.  You have excessive vaginal bleeding.    You fell and think you have a broken bone.  You have pain when you urinate.  You develop leg or  chest pain.  You have a fast pounding heart beat (palpitations).  You have severe headaches.  You develop vision problems.  You feel a lump in your breast.  You have abdominal pain or severe indigestion. Document Released: 11/10/2003 Document Revised: 04/22/2013 Document Reviewed: 03/19/2013 [facility] Patient Information 2015 [employer] [place]. This information is not intended to replace advice given to you by your health care provider. Make sure you discuss any questions you have with your health care provider.   [employer]

## 2015-01-27 NOTE — Progress Notes (Signed)
Patient ID: Florene GlenJulie A Cliff, female   DOB: 1961/04/02, 54 y.o.   MRN: [id number] S:  This 54 y.o. 1022P2002 Married Caucasian Fe here for consult visit.  She is having increase in acne.  Some bloating and breast tenderness that is off and on.  About 3 weeks ago had an increase in vaginal discharge like she was going to have another cycle.  She became so anxious that she did a UPT - which was negative. She has been off OCP only since March.  She also complains of weight gain despite her daily workout.  She does cardio every other day for about 35 minutes. The other days does yoga and walking.  She was seen by Dr. Edward JollySilva on 12/09/14 for stress urinary incontinence and at that time the [facility] AnFSH was 88.2 and Estradiol level was <11.8.  This would support a diagnosis of menopause.  She did have some initial vaso symptoms but that has greatly improved.  She mainly wants to know if this is  'Her new normal'.  She would like to take diet med's but we do not give them.  She is considering having her knee surgery before she considers out of pocket expenses for Uro- dynamics.  Recently added herbal med's with dandelion root to help her.  A: Peri menopausal - off OCP 11/2014  History of stress/ mixed incontinence    Plan: Will have her try OTC Estroven to see if symptoms are improved  Discussed her new normal  She will also be seeing dermatologist for treatment of acne  consult time: 15 minutes face to face

## 2015-01-30 NOTE — Progress Notes (Signed)
Encounter reviewed by Dr. Redith Drach Silva.  

## 2015-03-10 ENCOUNTER — Telehealth: Payer: Self-pay | Admitting: *Deleted

## 2015-03-10 NOTE — Telephone Encounter (Signed)
Call to patient, VM confirms first and last name. Left message to call back. Ask for triage. Calling to follow-up on  patient plans for urodynamic testing.

## 2015-04-15 NOTE — Telephone Encounter (Signed)
Call to patient to follow-up on urodynamic testing. Patient states symptoms come and go. She also has several other issues she is dealing with right now. Does not feel ready to proceed with this right now.  She will call back if desires to schedule, advised will need to schedule office visit for reassessment before scheduling. Patient voiced understanding. Order completed.  Routing to provider for final review. Patient agreeable to disposition. Will close encounter.

## 2015-05-04 ENCOUNTER — Other Ambulatory Visit: Payer: Self-pay | Admitting: Nurse Practitioner

## 2015-05-04 DIAGNOSIS — Z1231 Encounter for screening mammogram for malignant neoplasm of breast: Secondary | ICD-10-CM

## 2015-05-11 ENCOUNTER — Other Ambulatory Visit: Payer: Self-pay | Admitting: Nurse Practitioner

## 2015-05-11 ENCOUNTER — Ambulatory Visit
Admission: RE | Admit: 2015-05-11 | Discharge: 2015-05-11 | Disposition: A | Discharge status: Home / Self Care | Payer: BLUE CROSS/BLUE SHIELD | Source: Ambulatory Visit | Attending: Nurse Practitioner | Admitting: Nurse Practitioner

## 2015-05-11 DIAGNOSIS — Z1231 Encounter for screening mammogram for malignant neoplasm of breast: Secondary | ICD-10-CM | POA: Insufficient documentation

## 2015-10-31 ENCOUNTER — Encounter: Payer: Self-pay | Admitting: Nurse Practitioner

## 2015-10-31 ENCOUNTER — Telehealth: Payer: Self-pay | Admitting: Nurse Practitioner

## 2015-10-31 ENCOUNTER — Ambulatory Visit (INDEPENDENT_AMBULATORY_CARE_PROVIDER_SITE_OTHER): Payer: BLUE CROSS/BLUE SHIELD | Admitting: Nurse Practitioner

## 2015-10-31 VITALS — BP 110/58 | HR 64 | Resp 16 | Ht 62.75 in | Wt 145.6 lb

## 2015-10-31 DIAGNOSIS — N912 Amenorrhea, unspecified: Secondary | ICD-10-CM

## 2015-10-31 DIAGNOSIS — Z01419 Encounter for gynecological examination (general) (routine) without abnormal findings: Secondary | ICD-10-CM

## 2015-10-31 DIAGNOSIS — Z0000 Encounter for general adult medical examination without abnormal findings: Secondary | ICD-10-CM | POA: Diagnosis not present

## 2015-10-31 LAB — POCT URINALYSIS DIPSTICK
BILIRUBIN UA: NEGATIVE
GLUCOSE UA: NEGATIVE
Ketones, UA: NEGATIVE
Leukocytes, UA: NEGATIVE
NITRITE UA: NEGATIVE
Protein, UA: NEGATIVE
RBC UA: NEGATIVE
Urobilinogen, UA: NEGATIVE
pH, UA: 5

## 2015-10-31 NOTE — Telephone Encounter (Signed)
Patient has some questions regarding follow up lab work. Patient does not remember what Patty told her at her appointment today.

## 2015-10-31 NOTE — Progress Notes (Signed)
55 y.o. G2P2002 Married  Caucasian Fe here for annual exam.  Menses have been regular from July to November and heavy and bad cramps.  Since first of December now amenorrhea.  She started having bad acne and was treated with Doxycycline.  At that time had started back to having a monthly cycle that was like she had as a teenager - heavy and cramps. Acne is better.  Menses dates as follows: July 12 -15 heavy  Oct 21- 24 heavy Aug 8-11 heavy  Nov 15 -20 heavy Sept 3-6 heavy  Nov 29- Dec 4   light    Patient's last menstrual period was 08/07/2015 (exact date).          Sexually active: Yes.    The current method of family planning is vasectomy and condoms sometimes.    Exercising: Yes.    yoga, pilates, walking, light weights Smoker:  no  Health Maintenance: Pap:  10/27/14 HPV not detected MMG:  05/11/15 3D- Category B, Bi-Rads 1 Neg Colonoscopy: 06/ 2013 TDaP:  10/26/13 Labs: PCP Urine: wnl, pH 5, no traces   reports that she has never smoked. She has never used smokeless tobacco. She reports that she drinks about 0.6 oz of alcohol per week. She reports that she does not use illicit drugs.  Past Medical History  Diagnosis Date  . MVP (mitral valve prolapse)   . Abnormal Pap smear of cervix 11/97    LEEP, CIN III  . Vertigo     Past Surgical History  Procedure Laterality Date  . Colposcopy  11/97    CIN III  . Cervical biopsy  w/ loop electrode excision  11/97    CIN III  . Hysteroscopy  02/2003    benign polyps    Current Outpatient Prescriptions  Medication Sig Dispense Refill  . ACZONE 5 % topical gel Apply topically 2 (two) times daily.    Marland Kitchen aspirin EC 81 MG tablet Take by mouth.    . doxycycline (ORACEA) 40 MG capsule Take 40 mg by mouth daily.    Marland Kitchen glucosamine-chondroitin 500-400 MG tablet Take 1 tablet by mouth daily.    . Multiple Vitamin (MULTI-VITAMINS) TABS Take by mouth.    Marland Kitchen OVER THE COUNTER MEDICATION Take 1 capsule by mouth daily.     No current  facility-administered medications for this visit.    Family History  Problem Relation Age of Onset  . Osteoporosis Mother   . Osteoporosis Maternal Grandmother   . Diabetes Sister     ROS:  Pertinent items are noted in HPI.  Otherwise, a comprehensive ROS was negative.  Exam:   BP 110/58 mmHg  Pulse 64  Resp 16  Ht 5' 2.75" (1.594 m)  Wt 145 lb 9.6 oz (66.044 kg)  BMI 25.99 kg/m2  LMP 08/07/2015 (Exact Date) Height: 5' 2.75" (159.4 cm) Ht Readings from Last 3 Encounters:  10/31/15 5' 2.75" (1.594 m)  01/27/15 5' 2.75" (1.594 m)  12/09/14 5' 2.75" (1.594 m)    General appearance: alert, cooperative and appears stated age Head: Normocephalic, without obvious abnormality, atraumatic Neck: no adenopathy, supple, symmetrical, trachea midline and thyroid normal to inspection and palpation Lungs: clear to auscultation bilaterally Breasts: normal appearance, no masses or tenderness Heart: regular rate and rhythm Abdomen: soft, non-tender; no masses,  no organomegaly Extremities: extremities normal, atraumatic, no cyanosis or edema Skin: Skin color, texture, turgor normal. No rashes or lesions Lymph nodes: Cervical, supraclavicular, and axillary nodes normal. No abnormal inguinal nodes palpated  Neurologic: Grossly normal   Pelvic: External genitalia:  no lesions except for an inclusion cyst right labia majora              Urethra:  normal appearing urethra with no masses, tenderness or lesions              Bartholin's and Skene's: normal                 Vagina: normal appearing vagina with normal color and discharge, no lesions              Cervix: anteverted              Pap taken: No. Bimanual Exam:  Uterus:  normal size, contour, position, consistency, mobility, non-tender              Adnexa: no mass, fullness, tenderness               Rectovaginal: Confirms               Anus:  normal sphincter tone, no lesions  Chaperone present: yes  A:  Well Woman with normal  exam  History of irregular menses and on OCP for cycle regulation until 10/2014 History of CIN III with LEEP 11/97 normal since Urge/ stress incontinence  Current amenorrhea  P:   Reviewed health and wellness pertinent to exam  Pap smear as above  Mammogram is due 05/2016  Will keep a menses record again and if no menses by March will give her a Provera challenge.  Unable to do labs today for HIV, Hep C, and FSH - but we can do this after Provera Challenge or on another day.  Counseled on breast self exam, mammography screening, adequate intake of calcium and vitamin D, diet and exercise, Kegel's exercises return annually or prn  An After Visit Summary was printed and given to the patient.

## 2015-10-31 NOTE — Telephone Encounter (Signed)
Routing to Ria Comment, FNP for review and advise regarding lab recommendations.

## 2015-11-01 NOTE — Patient Instructions (Signed)

## 2015-11-01 NOTE — Telephone Encounter (Signed)
Left message to call Nishaan Stanke at [phone]. 

## 2015-11-01 NOTE — Progress Notes (Signed)
Encounter reviewed by Dr. Brook Amundson C. Silva.  

## 2015-11-01 NOTE — Telephone Encounter (Signed)
While pt was here for AEX - we tried to get [facility] but time was too late (5 pm).  We then discussed since amenorrhea since December - that we would wait 3 months.  Then if no menses to take a Provera challenge - she would then call with +/- bleeding.  If no bleeding we would do FSH at that time and check menopausal status.  The other health maintenance of HIV and Hep C also could not be done and we would do that at same time of [facility].  If she wants to return earlier to have all 3 done that is OK as well. Future orders are not placed but can be done.

## 2015-11-03 NOTE — Telephone Encounter (Signed)
Left message to call Kaitlyn at [phone]. 

## 2015-11-08 NOTE — Telephone Encounter (Signed)
Left message to call Kaitlyn at [phone]. 

## 2015-11-09 NOTE — Telephone Encounter (Signed)
Patient returning your call. [phone]

## 2015-11-09 NOTE — Telephone Encounter (Signed)
Spoke with patient. Advised of message as seen below from Ria CommentPatricia Grubb, FNP. She is agreeable and verbalizes understanding. She will return call to the office after 3 months of amenorrhea to start on Provera challenge. She is agreeable and verbalizes understanding.  Routing to provider for final review. Patient agreeable to disposition. Will close encounter.

## 2015-11-11 ENCOUNTER — Encounter: Payer: Self-pay | Admitting: Urology

## 2015-11-11 ENCOUNTER — Ambulatory Visit (INDEPENDENT_AMBULATORY_CARE_PROVIDER_SITE_OTHER): Payer: BLUE CROSS/BLUE SHIELD | Admitting: Urology

## 2015-11-11 DIAGNOSIS — N3946 Mixed incontinence: Secondary | ICD-10-CM | POA: Diagnosis not present

## 2015-11-11 DIAGNOSIS — N3941 Urge incontinence: Secondary | ICD-10-CM

## 2015-11-11 LAB — BLADDER SCAN AMB NON-IMAGING

## 2015-11-11 MED ORDER — MIRABEGRON ER 25 MG PO TB24: 25.0000 mg | ORAL_TABLET | Freq: Every day | ORAL | Status: DC

## 2015-11-11 NOTE — Progress Notes (Signed)
Bladder Scan Patient void: 15ml Performed By: K.Kaleigh Spiegelman,CMA 

## 2015-11-11 NOTE — Progress Notes (Signed)
11/11/2015 3:07 PM   Madison GlenJulie A Griswold 10-26-1960 [id number]  Referring provider: Marguarite ArbourJeffrey D Sparks, MD [address] [facility] [place], [place] [id number]  Chief Complaint  Patient presents with  . Follow-up    urinary  urgency     HPI: The patient is a 55 year old woman who leaks with coughing sneezing it up any lifting. Her primary problem is urge incontinence wearing 2 pads a day which are damp. She may little bit of enuresis. Kegel exercises. She gets up twice at night to void. In the morning she can void 4-6 times before she leaves for work. She'll void quite frequently. She consented to it to our movie she reduces her fluid intake. her flow was good    She has not had previous bladder surgery kidney stones. She does not get urinary tract infections. The symptoms of worsening over many months. Dr. Edward JollySilva recommending urodynamics but she decided not to proceed with them that time.  She has no neurologic defects or symptoms. She has not had a hysterectomy. Her bowel movements are normal. Her presentation is not medically treated. She is hoping maybe help without surgery  Modifying factors: There are no other modifying factors  Associated signs and symptoms: There are no other associated signs and symptoms Aggravating and relieving factors: There are no other aggravating or relieving factors Severity: Moderate Duration: Persistent   PMH: Past Medical History  Diagnosis Date  . MVP (mitral valve prolapse)   . Abnormal Pap smear of cervix 11/97    LEEP, CIN III  . Vertigo   . Acid reflux     Surgical History: Past Surgical History  Procedure Laterality Date  . Colposcopy  11/97    CIN III  . Cervical biopsy  w/ loop electrode excision  11/97    CIN III  . Hysteroscopy  02/2003    benign polyps    Home Medications:    Medication List       This list is accurate as of: 11/11/15  3:07 PM.  Always use your most recent med list.               ACZONE 5  % topical gel  Generic drug:  Dapsone  Apply topically 2 (two) times daily.     ADVIL 200 MG Caps  Generic drug:  Ibuprofen  Take 1 capsule by mouth.     aspirin EC 81 MG tablet  Take by mouth. Reported on 11/11/2015     doxycycline 40 MG capsule  Commonly known as:  ORACEA  Take 40 mg by mouth daily.     glucosamine-chondroitin 500-400 MG tablet  Take 1 tablet by mouth daily.     MULTI-VITAMINS Tabs  Take by mouth.     OVER THE COUNTER MEDICATION  Take 1 capsule by mouth daily. Reported on 11/11/2015        Allergies: No Known Allergies  Family History: Family History  Problem Relation Age of Onset  . Osteoporosis Mother   . Osteoporosis Maternal Grandmother   . Diabetes Sister     Social History:  reports that she has never smoked. She has never used smokeless tobacco. She reports that she drinks about 0.6 oz of alcohol per week. She reports that she does not use illicit drugs.  ROS: UROLOGY Frequent Urination?: No Hard to postpone urination?: Yes Burning/pain with urination?: No Get up at night to urinate?: Yes Leakage of urine?: Yes Urine stream starts and stops?: No Trouble starting  stream?: No Do you have to strain to urinate?: No Blood in urine?: No Urinary tract infection?: No Sexually transmitted disease?: No Injury to kidneys or bladder?: No Painful intercourse?: No Weak stream?: No Currently pregnant?: No Vaginal bleeding?: No Last menstrual period?: 08/2015  Gastrointestinal Nausea?: No Vomiting?: No Indigestion/heartburn?: No Diarrhea?: No Constipation?: No  Constitutional Fever: No Night sweats?: Yes Weight loss?: No Fatigue?: No  Skin Skin rash/lesions?: No Itching?: No  Eyes Blurred vision?: No Double vision?: No  Ears/Nose/Throat Sore throat?: No Sinus problems?: No  Hematologic/Lymphatic Swollen glands?: No Easy bruising?: No  Cardiovascular Leg swelling?: No Chest pain?: No  Respiratory Cough?:  Yes Shortness of breath?: No  Endocrine Excessive thirst?: No  Musculoskeletal Back pain?: Yes Joint pain?: No  Neurological Headaches?: No Dizziness?: No  Psychologic Depression?: No Anxiety?: No  Physical Exam: LMP 08/07/2015 (Exact Date)  Constitutional:  Alert and oriented, No acute distress. HEENT: [place] AT, moist mucus membranes.  Trachea midline, no masses. Cardiovascular: No clubbing, cyanosis, or edema. Respiratory: Normal respiratory effort, no increased work of breathing. GI: Abdomen is soft, nontender, nondistended, no abdominal masses GU: The patient had mild hypermobility of the bladder neck with a little bit of descensus at rest. She had a negative cough test and no prolapse Skin: No rashes, bruises or suspicious lesions. Lymph: No cervical or inguinal adenopathy. Neurologic: Grossly intact, no focal deficits, moving all 4 extremities. Psychiatric: Normal mood and affect.  Laboratory Data: Lab Results  Component Value Date   WBC 4.3 12/01/2012   HGB 13.2 12/01/2012   HCT 38.2 12/01/2012   MCV 102* 12/01/2012   PLT 146* 12/01/2012    Lab Results  Component Value Date   CREATININE 0.71 12/01/2012    No results found for: PSA  No results found for: TESTOSTERONE  No results found for: HGBA1C  Urinalysis    Component Value Date/Time   BILIRUBINUR neg 10/31/2015 1628   PROTEINUR neg 10/31/2015 1628   UROBILINOGEN negative 10/31/2015 1628   NITRITE neg 10/31/2015 1628   LEUKOCYTESUR Negative 10/31/2015 1628    Pertinent Imaging: None  Assessment & Plan:  The patient is mixed incontinence but primarily urge incontinence. She is fluid dependent frequency. She is mild or nocturia. [place] urodynamics discussed.  I thought it was reasonable to add overactive bladder medication to her already performed pelvic floor exercises. Role of physical therapy again mentioned.  I will see back on the beta 3 agonists in 1 month. She would like to see the  physical therapy team in [employer]. If she reaches her treatment goal we will stop medication.  1. Urge incontinence of urine 2 nocturia  3 enuresis    , Complete - Bladder Scan (Post Void Residual) in office    nocturia  Martina Sinner, MD  [facility] [address] [place], [place] [address] [phone]

## 2015-11-12 LAB — URINALYSIS, COMPLETE
BILIRUBIN UA: NEGATIVE
GLUCOSE, UA: NEGATIVE
Ketones, UA: NEGATIVE
Leukocytes, UA: NEGATIVE
NITRITE UA: NEGATIVE
PH UA: 6.5 (ref 5.0–7.5)
PROTEIN UA: NEGATIVE
Specific Gravity, UA: 1.02 (ref 1.005–1.030)
UUROB: 0.2 mg/dL (ref 0.2–1.0)

## 2015-11-12 LAB — MICROSCOPIC EXAMINATION
BACTERIA UA: NONE SEEN
Epithelial Cells (non renal): NONE SEEN /hpf (ref 0–10)

## 2015-12-22 ENCOUNTER — Ambulatory Visit: Payer: BLUE CROSS/BLUE SHIELD | Attending: Urology | Admitting: Physical Therapy

## 2015-12-22 DIAGNOSIS — N3946 Mixed incontinence: Secondary | ICD-10-CM | POA: Insufficient documentation

## 2015-12-22 DIAGNOSIS — R531 Weakness: Secondary | ICD-10-CM | POA: Diagnosis present

## 2015-12-22 DIAGNOSIS — M545 Low back pain, unspecified: Secondary | ICD-10-CM

## 2015-12-22 DIAGNOSIS — R279 Unspecified lack of coordination: Secondary | ICD-10-CM | POA: Diagnosis present

## 2015-12-22 NOTE — Therapy (Signed)
[facility] MAIN [facility] SERVICES [address] [place], [place], [address] Phone: [phone]   Fax:  [phone]  Physical Therapy Evaluation  Patient Details  Name: Madison Evans MRN: [id number] Date of Birth: 03-09-1961 No Data Recorded  Encounter Date: 12/22/2015      PT End of Session - 12/22/15 1845    Visit Number 1   Number of Visits 12   Date for PT Re-Evaluation 02/16/16   PT Start Time 1705   PT Stop Time 1825   PT Time Calculation (min) 80 min   Activity Tolerance Patient tolerated treatment well   Behavior During Therapy [facility] for tasks assessed/performed      Past Medical History  Diagnosis Date  . MVP (mitral valve prolapse)   . Abnormal Pap smear of cervix 11/97    LEEP, CIN III  . Vertigo   . Acid reflux     Past Surgical History  Procedure Laterality Date  . Colposcopy  11/97    CIN III  . Cervical biopsy  w/ loop electrode excision  11/97    CIN III  . Hysteroscopy  02/2003    benign polyps    There were no vitals filed for this visit.       Subjective Assessment - 12/22/15 1712    Subjective 1) Pt reported urge incontinence, SUI with exercise,  urgency which has been progressively worse over the past 1 year since return to work outside her home with 2 hr round trip  commute + 8 -9 hr of sitting a day. Changing pads 2x/ day.  Urine stream deviated to the right. Modifies her community activities w/ orientation and accessibility to public toilets.  Fluid intake:  1 cup of coffee,  48-64 fl oz of water. Bowel movements 1x  daily Type 4.       2) chronic LBP non-radiating and localized at SIJ: morning/ night : 8/10,  currently numb 4/10.     Pt has had back pain since 7-8 years of as a child (long term swimmer), neck pain returned for the past 6 months. Pain with household chores, gardening, exercises, sleeping  3) additional joint pain: a) neck pain: 4-5/10 occurs with driving, and limited range with head turn R  for changing lanes, carrying office suppies over shoulder. b) Knee pain: difficulty with squatting during exercises   4) poor sleep: due to a cluster of Sx: neck/back pain which makes it difficult to find a comfortable position. Nocturia 2x /night. Nightsweats once a night some night ,hot flashes 1x / day more regularly this month. Husband states pt snores sometimes.  Hx of cardiac conditions: Mitral valve prolapse.                    Pertinent History Hx of a fall onto her tailbone after a cheerleading routine. Hx of vaginal procedures to remove hematoma after vaginal delivery in 1985 and to remove a polyp from a reproductive organ which pt can not recall.    Patient Stated Goals to avoid medications for urinary issues and gain bladder control, improve posture, pain, and sleep             [facility] PT Assessment - 12/22/15 1832    Assessment   Medical Diagnosis mixed urinary incontinence   Precautions   Precautions None   Restrictions   Weight Bearing Restrictions No   Balance Screen   Has the patient fallen in the past 6 months No  Observation/Other Assessments   Observations poor posture, slumped sitting, legs crossed,    Coordination   Gross Motor Movements are Fluid and Coordinated --  diaphragmatic breathing w/o cuing   Fine Motor Movements are Fluid and Coordinated --  pelvic floor ROM coordinated w/o cuing   Squat   Comments changing linens, loading dishwasher with forward fold (p!), no pain with sit to stand    Posture/Postural Control   Posture Comments lumbopelvic instability with hip flexion and bed mobility   Strength   Overall Strength Comments sidelying hip abd 2/5 B due to p!   Palpation   SI assessment  decreased posterior rotation of L iliac on sacrum in standing hip flexion (pre-Tx), increased post-Tx.   Palpation comment decreased mobility along lateral edge of L pSIS and sacrum at S2,3 of sacrum    Bed Mobility   Bed Mobility --   poor mechanics, sidelying w/o  pillow, no p! w/ pillow cue                 Pelvic Floor Special Questions - 12/22/15 1840    Diastasis Recti 3 fingers below sternum           [facility] Adult PT Treatment/Exercise - 12/22/15 1832    Therapeutic Activites    Therapeutic Activities --  see pt instructions   Neuro Re-ed    Neuro Re-ed Details  see pt instructions and education    Manual Therapy   Manual therapy comments inferior/superior mob of sacrum in sidelying, ortational mob into hip flexion of L              PT Long Term Goals - 12/23/15 2206    PT LONG TERM GOAL #1   Title Pt will decrease pad use from 2x/ day to < 1x /day on work day   in order to save money and improve QOL.    Time 12   Period Weeks   Status New   PT LONG TERM GOAL #2   Title Pt will report not avoiding drinking water when traveling on trips due to fear of not having access to public bathrooms. and report not sitting at the end of theatre seats for easy access to toilets in order to show improve control of bladder to enjoy social events.    Time 12   Period Weeks   Status New   PT LONG TERM GOAL #3   Title Pt will demo < 1.5 fingers width below sternum to improve DRA and decrease back pain to perform household chores.    Time 12   Period Weeks   Status New   PT LONG TERM GOAL #4   Title Pt demo proper sitting posture across 2 visits without cuing in order optimize deep core system and minimize futher risk of injury.    Time 12   Period Weeks   Status New   PT LONG TERM GOAL #5   Title Pt will demo symmetrical alignment of SIJ and normal mobility of ilia<> sacrum in standing hip flexion (Stork test)  on LLE  across 2 visits in order to improve bed mobility and sleep.    Time 12   Period Weeks   Status New   PT LONG TERM GOAL #6   Title Pt will report more symmterical urine stream in order to return to ADLs.    Time 12   Period Weeks   Status New   PT LONG TERM GOAL #7   Title Pt will  decrease her PFDI score from  44% to < 34% in order to improve QOL and pelvic floor function   Time 12   Period Weeks   Status New   PT LONG TERM GOAL #8   Title Pt will decrease her PGQ score from 49% to < 25% in order to return to loaded activities such as lifting and exercising.   Time 12   Period Weeks   Status New   PT LONG TERM GOAL  #9   TITLE Pt will de crease her PSQI score from 29% to < 15% in order to improve sleep quality.                         Plan - 12/22/15 1850    Clinical Impression Statement Pt is a 55 yo female whose complaints include mixed incontinence, chronic pain at LBP, neck, knees, and poor sleep. These deficits impact her ability to perform ADLs, household chores, gardening, and exercises. Pt 's clinical presentations include diastasis recti, sacro-iliac dysfunction, hip weakness, poor posture/ body mechanics, and lack of coordination of pelvic floor mm and deep core mm. LBP was associated with pelvic obliquities and limited hip mobility, both of  which improved with manual Tx today. Pt reported she noticed she is "moving better" and was able to demonstrate modifications to her ADLs with proper body mechanics that decreased her pain.  Pt may benefit from a sleep study given her cardiac condition (MVP), snoring, and nocturia.    Rehab Potential Good   PT Frequency 1x / week   PT Duration 12 weeks   PT Treatment/Interventions ADLs/Self Care Home Management;Aquatic Therapy;Cryotherapy;Gait training;Moist Heat;Electrical Stimulation;Biofeedback;Stair training;Functional mobility training;Therapeutic activities;Therapeutic exercise;Balance training;Neuromuscular re-education;Patient/family education;Manual techniques;Scar mobilization;Passive range of motion;Taping   Consulted and Agree with Plan of Care Patient      Patient will benefit from skilled therapeutic intervention in order to improve the following deficits and impairments:  Decreased balance, Decreased coordination,  Decreased mobility, Decreased safety awareness, Decreased activity tolerance, Decreased endurance, Decreased range of motion, Decreased strength, Difficulty walking, Hypomobility, Hypermobility, Pain, Improper body mechanics, Postural dysfunction  Visit Diagnosis: Bilateral low back pain without sciatica  Mixed incontinence  Weakness generalized  Unspecified lack of coordination     Problem List Patient Active Problem List   Diagnosis Date Noted  . History of migraine headaches 10/01/2014  . History of abnormal cervical Pap smear 10/26/2013  . Urge incontinence of urine 10/26/2013    Mariane Masters ,PT, DPT, E-RYT  12/22/2015, 7:12 PM  [facility] MAIN [facility] SERVICES [address] [place], [place], [address] Phone: [phone]   Fax:  [phone]  Name: Madison Evans MRN: [id number] Date of Birth: Mar 12, 1961

## 2015-12-22 NOTE — Patient Instructions (Signed)
BED:   Logrolling into bed (one arm slide)  Logrolling out   Putting linens on with mini squat  + side step  Sleeping with pillow between knees    * All these pattern help to "hug" the sacro-iliac joint   ______________________   CAR: Modify  With towels along the back (vertical ) and fill in bucket seat.  Keep back of hips against the seat.    Counter stretch after drive:  To keep L hip looser ( towel behind thigh and knee to chest of L ) seated or on back 5 x Lifting leg on exhale.

## 2015-12-26 ENCOUNTER — Ambulatory Visit: Payer: BLUE CROSS/BLUE SHIELD

## 2015-12-30 ENCOUNTER — Encounter: Payer: BLUE CROSS/BLUE SHIELD | Admitting: Physical Therapy

## 2016-01-03 ENCOUNTER — Ambulatory Visit: Payer: BLUE CROSS/BLUE SHIELD | Attending: Urology | Admitting: Physical Therapy

## 2016-01-03 DIAGNOSIS — M545 Low back pain, unspecified: Secondary | ICD-10-CM

## 2016-01-03 DIAGNOSIS — M25511 Pain in right shoulder: Secondary | ICD-10-CM | POA: Insufficient documentation

## 2016-01-03 DIAGNOSIS — R531 Weakness: Secondary | ICD-10-CM

## 2016-01-03 DIAGNOSIS — N3946 Mixed incontinence: Secondary | ICD-10-CM | POA: Insufficient documentation

## 2016-01-03 DIAGNOSIS — R279 Unspecified lack of coordination: Secondary | ICD-10-CM | POA: Diagnosis present

## 2016-01-03 DIAGNOSIS — M25512 Pain in left shoulder: Secondary | ICD-10-CM | POA: Diagnosis present

## 2016-01-03 NOTE — Patient Instructions (Addendum)
1) open book (handout)  15 rep each side     You are now ready to begin training the deep core muscles system: diaphragm, transverse abdominis, pelvic floor . These muscles must work together as a team.       The key to these exercises to train the brain to coordinate the timing of these muscles and to have them turn on for long periods of time to hold you upright against gravity (especially important if you are on your feet all day).These muscles are postural muscles and play a role stabilizing your spine and bodyweight. By doing these repetitions slowly and correctly instead of doing crunches, you will achieve a flatter belly without a lower pooch. You are also placing your spine in a more neutral position and breathing properly which in turn, decreases your risk for problems related to your pelvic floor, abdominal, and low back such as pelvic organ prolapse, hernias, diastasis recti (separation of superficial muscles), disk herniations, spinal fractures. These exercises set a solid foundation for you to later progress to resistance/ strength training with therabands and weights and return to other typical fitness exercises with a stronger deeper core.  2)   Level 1 with pillow under hips, 30 reps x 2 sets    3)  Level 2, no pillow under hips   30 reps x 2 sets

## 2016-01-03 NOTE — Therapy (Addendum)
[place] [facility] MAIN [facility] SERVICES [address] [place], [place], [address] Phone: [phone]   Fax:  [phone]  Physical Therapy Treatment  Patient Details  Name: Madison Evans MRN: [id number] Date of Birth: 24-Feb-1961 Referring Provider: Sherron Monday  Encounter Date: 01/03/2016      PT End of Session - 01/03/16 1813    Visit Number 2   Number of Visits 12   Date for PT Re-Evaluation 03/15/16   PT Start Time 1705   PT Stop Time 1800   PT Time Calculation (min) 55 min   Activity Tolerance Patient tolerated treatment well   Behavior During Therapy [facility] for tasks assessed/performed      Past Medical History  Diagnosis Date  . MVP (mitral valve prolapse)   . Abnormal Pap smear of cervix 11/97    LEEP, CIN III  . Vertigo   . Acid reflux     Past Surgical History  Procedure Laterality Date  . Colposcopy  11/97    CIN III  . Cervical biopsy  w/ loop electrode excision  11/97    CIN III  . Hysteroscopy  02/2003    benign polyps    There were no vitals filed for this visit.      Subjective Assessment - 01/03/16 1708    Subjective Pt reported her tailbone ached for 3 days after last session. Pt had no LBP at night but continues to have LBP in the morning. Pt followed PT's recommendation to decrease water before bed and now she wakes up only 1 x a night instead 3x night.  Pt provided  a log of urinating 7x  between 6-11:20am after drinking 20 fl oz of water and 8 oz cup of coffee.    Pertinent History Hx of a fall onto her tailbone after a cheerleading routine. Hx of vaginal procedures to remove hematoma after vaginal delivery in 1985 and to remove a polyp from a reproductive organ which pt can not recall.    Patient Stated Goals to avoid medications for urinary issues and gain bladder control, improve posture, pain, and sleep                       Pelvic Floor Special Questions - 01/03/16 1751    Prolapse Anterior Wall   bladder in more caudal position post Tx   Pelvic Floor Internal Exam Pt consented verbally without contraindication    Exam Type Vaginal   Palpation increased tenderness/tensions on R bulbospongiosus    Strength fair squeeze, definite lift  initially 2/5 posterior    Biofeedback proper breathing coordination to elicit pelvic floor coordination           [facility] Adult PT Treatment/Exercise - 01/03/16 0001    Neuro Re-ed    Neuro Re-ed Details  see pt instructions    Manual Therapy   Manual therapy comments fascial release over suprapubic area to faciliate more  deep core activation to provide lift of pelvic floor    Internal Pelvic Floor faciliation of ischiocavernosus/ urethralcompressor B to elicit anterior contraction of pelvic floor                 PT Education - 01/03/16 1813    Education provided Yes   Education Details HEP, anatomy/ physiology    Person(s) Educated Patient   Methods Explanation;Demonstration;Tactile cues;Handout;Verbal cues   Comprehension Returned demonstration;Verbalized understanding             PT Long  Term Goals - 12/23/15 2206    PT LONG TERM GOAL #1   Title Pt will decrease pad use from 2x/ day to < 1x /day on work day   in order to save money and improve QOL.    Time 12   Period Weeks   Status New   PT LONG TERM GOAL #2   Title Pt will report not avoiding drinking water when traveling on trips due to fear of not having access to public bathrooms. and report not sitting at the end of theatre seats for easy access to toilets in order to show improve control of bladder to enjoy social events.    Time 12   Period Weeks   Status New   PT LONG TERM GOAL #3   Title Pt will demo < 1.5 fingers width below sternum to improve DRA and decrease back pain to perform household chores.    Time 12   Period Weeks   Status New   PT LONG TERM GOAL #4   Title Pt demo proper sitting posture across 2 visits without cuing in order optimize deep core  system and minimize futher risk of injury.    Time 12   Period Weeks   Status New   PT LONG TERM GOAL #5   Title Pt will demo symmetrical alignment of SIJ and normal mobility of ilia<> sacrum in standing hip flexion (Stork test)  on LLE  across 2 visits in order to improve bed mobility and sleep.    Time 12   Period Weeks   Status New   PT LONG TERM GOAL #6   Title Pt will report more medially aligned urine stream in order to return to ADLs.    Time 12   Period Weeks   Status New   PT LONG TERM GOAL #7   Title Pt will decrease her PFDI score from 44% to < 34% in order to improve QOL and pelvic floor function   Time 12   Period Weeks   Status New   PT LONG TERM GOAL #8   Title Pt will decrease her PGQ score from 49% to < 25% in order to return to loaded activities such as lifting and exercising.   Time 12   Period Weeks   Status New   PT LONG TERM GOAL  #9   TITLE Pt will de crease her PSQI score from 29% to < 15% in order to improve sleep quality.               Plan - 01/03/16 1813    Clinical Impression Statement Pt showed a more symmetrically aligned pelvis and no tenderness to palpation which demonstrated a good carry over of manual Tx that was performed at last session. Pt also has decreased nocturia after following recommendations for decreased fluid intake prior to bed. After today's Tx, pt showed improved coordination of pelvic floor, diaphragm, TrA along with decreased mm tension on R pelvic floor mm, a more caudal position of her bladder, and  a more circumferential contraction of her pelvic floor muscles. Anticipate today's interventions will help improve pt's c/o deviated urine stream and urinary leakage and urge.      Rehab Potential Good   PT Frequency 1x / week   PT Duration 12 weeks   PT Treatment/Interventions ADLs/Self Care Home Management;Aquatic Therapy;Cryotherapy;Gait training;Moist Heat;Electrical Stimulation;Biofeedback;Stair training;Functional  mobility training;Therapeutic activities;Therapeutic exercise;Balance training;Neuromuscular re-education;Patient/family education;Manual techniques;Scar mobilization;Passive range of motion;Taping   Consulted and Agree with Plan  of Care Patient      Patient will benefit from skilled therapeutic intervention in order to improve the following deficits and impairments:  Decreased balance, Decreased coordination, Decreased mobility, Decreased safety awareness, Decreased activity tolerance, Decreased endurance, Decreased range of motion, Decreased strength, Difficulty walking, Hypomobility, Hypermobility, Pain, Improper body mechanics, Postural dysfunction  Visit Diagnosis: Bilateral low back pain without sciatica  Mixed incontinence  Weakness generalized  Unspecified lack of coordination     Problem List Patient Active Problem List   Diagnosis Date Noted  . History of migraine headaches 10/01/2014  . History of abnormal cervical Pap smear 10/26/2013  . Urge incontinence of urine 10/26/2013    Mariane MastersYeung,Shin Yiing ,PT, DPT, E-RYT  01/03/2016, 6:18 PM  [place] [facility] [address] [place], [place], [address] Phone: [phone]   Fax:  [phone]  Name: Florene GlenJulie A Blattner MRN: [id number] Date of Birth: 1961/02/27

## 2016-01-05 ENCOUNTER — Telehealth: Payer: Self-pay

## 2016-01-05 MED ORDER — MEDROXYPROGESTERONE ACETATE 10 MG PO TABS: 10.0000 mg | ORAL_TABLET | Freq: Every day | ORAL | Status: DC

## 2016-01-05 NOTE — Telephone Encounter (Signed)
Spoke with patient. Patient is calling to report she has not had a cycle in three months. Advised per recommendations from Ria CommentPatricia Grubb, FNP she will need to start Provera challenge at this time. Advised she will take Provera 10 mg x 10 days take 1 tablet daily. She will then need to monitor for any bleeding for 2 weeks after completion of her last dose of the Provera. Will need to contact the office with positive or negative bleed. She is agreeable. Rx for Provera 10 mg #10 0RF sent to pharmacy on file.   Ria CommentPatricia Grubb, FNP at 11/01/2015 8:31 AM     Status: Signed       Expand All Collapse All   While pt was here for AEX - [facility] but time was too [facility] (5 pm). We then discussed since amenorrhea since December - that we would wait 3 months. Then if no menses to take a Provera challenge - she would then call with +/- bleeding. If no bleeding we would do FSH at that time and check menopausal status. The other health maintenance of HIV and Hep C also could not be done and we would do that [facility]. If she wants to return earlier to have all 3 done that is OK as well. Future orders are not placed but can be done.       Routing to provider for final review. Patient agreeable to disposition. Will close encounter.

## 2016-01-05 NOTE — Telephone Encounter (Signed)
Patient wanting to speak and follow up with nurse. Best # to reach: [phone]

## 2016-01-10 ENCOUNTER — Ambulatory Visit: Payer: BLUE CROSS/BLUE SHIELD | Admitting: Physical Therapy

## 2016-01-10 DIAGNOSIS — R279 Unspecified lack of coordination: Secondary | ICD-10-CM

## 2016-01-10 DIAGNOSIS — M545 Low back pain, unspecified: Secondary | ICD-10-CM

## 2016-01-10 DIAGNOSIS — N3946 Mixed incontinence: Secondary | ICD-10-CM

## 2016-01-10 DIAGNOSIS — R531 Weakness: Secondary | ICD-10-CM

## 2016-01-10 NOTE — Patient Instructions (Addendum)
On your back:  " w" with red band 10 x 3 reps  Deep core knee out  10 x 3 reps  ____________________   ROM :  Neck 6 directions Shoulders: snow angel before point of pain   ____________________   Standing:   Door twist  15-20 deg away fromdoor  With band on other side of door knob Without moving hips and knees   10 rep x 2  Each side x 3 x day    Single balance on Right  with bicep curls  10 x reps x 2 x 3

## 2016-01-11 NOTE — Therapy (Addendum)
[facility] [address] [place], [place], [id number] Phone: [phone]   Fax:  [phone]  Physical Therapy Treatment  Patient Details  Name: [place] GlenJulie A Mccarter MRN: [id number] Date of Birth: 09/29/1960 Referring Provider: Sherron MondayMacDiarmid  Encounter Date: 01/10/2016      PT End of Session - 01/11/16 2342    Visit Number 3   Number of Visits 12   Date for PT Re-Evaluation 03/15/16   PT Start Time 1700   PT Stop Time 1800   PT Time Calculation (min) 60 min   Activity Tolerance Patient tolerated treatment well   Behavior During Therapy [facility] for tasks assessed/performed      Past Medical History  Diagnosis Date  . MVP (mitral valve prolapse)   . Abnormal Pap smear of cervix 11/97    LEEP, CIN III  . Vertigo   . Acid reflux     Past Surgical History  Procedure Laterality Date  . Colposcopy  11/97    CIN III  . Cervical biopsy  w/ loop electrode excision  11/97    CIN III  . Hysteroscopy  02/2003    benign polyps    There were no vitals filed for this visit.      Subjective Assessment - 01/11/16 2339    Subjective 1) Pt reported LBP was triggered with excessive movements and it lasted for 3 days after ice, ibuprofen. 2) urge :  Pt tried not using a pad all day yesterday / night and had no leakage. Pt has been using breathing technique to fall back asleep. Pt has decreased her nocturia from 3 x to 1-2 night.     Pertinent History Hx of a fall onto her tailbone after a cheerleading routine. Hx of vaginal procedures to remove hematoma after vaginal delivery in 1985 and to remove a polyp from a reproductive organ which pt can not recall.    Patient Stated Goals to avoid medications for urinary issues and gain bladder control, improve posture, pain, and sleep             [facility] PT Assessment - 01/11/16 2340    Coordination   Gross Motor Movements are Fluid and Coordinated --  improved deep core coordination with less  cuing   Palpation   Spinal mobility increased mm tensions along R occiput mm (decreased, no pain and report of increased ROM L rotation/sideflexion post-Tx)                    Pelvic Floor Special Questions - 01/11/16 2339    Diastasis Recti 2 fingers width   Pelvic Floor Internal Exam Pt consented verbally without contraindication    Exam Type Vaginal   Palpation no mm tensions   Strength good squeeze, good lift, able to hold agaisnt strong resistance           [facility] Adult PT Treatment/Exercise - 01/11/16 2342    Neuro Re-ed    Neuro Re-ed Details  see pt instructions   Exercises   Exercises --  see pt instructions   Manual Therapy   Manual therapy comments sustained pressure and distraction, occiput release at R    Myofascial Release quadriped, TrA   Kinesiotex --  below sternum                PT Education - 01/10/16 1713    Education provided Yes   Education Details HEP   Person(s) Educated Patient   Methods Explanation;Demonstration;Tactile  cues;Verbal cues;Handout   Comprehension Returned demonstration;Verbalized understanding             PT Long Term Goals - 01/11/16 2344    PT LONG TERM GOAL #1   Title Pt will decrease pad use from 2x/ day to < 1x /day on work day   in order to save money and improve QOL.    Time 12   Period Weeks   Status On-going   PT LONG TERM GOAL #2   Title Pt will report not avoiding drinking water when traveling on trips due to fear of not having access to public bathrooms. and report not sitting at the end of theatre seats for easy access to toilets in order to show improve control of bladder to enjoy social events.    Time 12   Period Weeks   Status On-going   PT LONG TERM GOAL #3   Title Pt will demo < 1.5 fingers width below sternum to improve DRA and decrease back pain to perform household chores.    Time 12   Period Weeks   Status On-going   PT LONG TERM GOAL #4   Title Pt demo proper sitting posture  across 2 visits without cuing in order optimize deep core system and minimize futher risk of injury.    Time 12   Period Weeks   Status On-going   PT LONG TERM GOAL #5   Title Pt will demo symmetrical alignment of SIJ and normal mobility of ilia<> sacrum in standing hip flexion (Stork test)  on LLE  across 2 visits in order to improve bed mobility and sleep.    Time 12   Period Weeks   Status On-going   PT LONG TERM GOAL #6   Title Pt will report more symmterical urine stream in order to return to ADLs.    Time 12   Period Weeks   Status On-going   PT LONG TERM GOAL #7   Title Pt will decrease her PFDI score from 44% to < 34% in order to improve QOL and pelvic floor function   Time 12   Period Weeks   Status On-going   PT LONG TERM GOAL #8   Title Pt will decrease her PGQ score from 49% to < 25% in order to return to loaded activities such as lifting and exercising.   Time 12   Period Weeks   Status On-going   PT LONG TERM GOAL  #9   TITLE Pt will de crease her PSQI score from 29% to < 15% in order to improve sleep quality.   Status On-going               Plan - 01/11/16 2343    Clinical Impression Statement Pt was unable to perform resistive band exercise for outer core strengthening due R neck tensions . Manual Tx helped to decrease  R neck tensions, increase cervical rotation and sideflexion L and pt was able to perform thoracolumbar strengthening exercises.  Anticipate posterior strengthening will be beneficial due to pt's sedentary job. Pt's DRA decreased w/ manual Tx. Suspect pt's DRA is related prior postural/movement patterns as well as positions performed for many years with pilates.  Pt will continue benefit from skilled PT.    Rehab Potential Good   PT Frequency 1x / week   PT Duration 12 weeks   PT Treatment/Interventions ADLs/Self Care Home Management;Aquatic Therapy;Cryotherapy;Gait training;Moist Heat;Electrical Stimulation;Biofeedback;Stair  training;Functional mobility training;Therapeutic activities;Therapeutic exercise;Balance training;Neuromuscular re-education;Patient/family education;Manual techniques;Scar mobilization;Passive  range of motion;Taping   Consulted and Agree with Plan of Care Patient      Patient will benefit from skilled therapeutic intervention in order to improve the following deficits and impairments:  Decreased balance, Decreased coordination, Decreased mobility, Decreased safety awareness, Decreased activity tolerance, Decreased endurance, Decreased range of motion, Decreased strength, Difficulty walking, Hypomobility, Hypermobility, Pain, Improper body mechanics, Postural dysfunction  Visit Diagnosis: No diagnosis found.     Problem List Patient Active Problem List   Diagnosis Date Noted  . History of migraine headaches 10/01/2014  . History of abnormal cervical Pap smear 10/26/2013  . Urge incontinence of urine 10/26/2013    Mariane Masters ,PT, DPT, E-RYT  01/11/2016, 11:48 PM  [place] [facility] MAIN [facility] SERVICES [address] [place], [place], [address] Phone: [phone]   Fax:  [phone]  Name: NESIAH JUMP MRN: [id number] Date of Birth: 10-Nov-1960

## 2016-01-17 ENCOUNTER — Ambulatory Visit: Payer: BLUE CROSS/BLUE SHIELD | Admitting: Physical Therapy

## 2016-01-17 DIAGNOSIS — R279 Unspecified lack of coordination: Secondary | ICD-10-CM

## 2016-01-17 DIAGNOSIS — M545 Low back pain, unspecified: Secondary | ICD-10-CM

## 2016-01-17 DIAGNOSIS — R531 Weakness: Secondary | ICD-10-CM

## 2016-01-17 DIAGNOSIS — N3946 Mixed incontinence: Secondary | ICD-10-CM

## 2016-01-17 NOTE — Patient Instructions (Addendum)
Prone Heel Press for strengthening sacro-iliac joints  1. Lie on your belly. If you have an arch in your low back or it feels umcomfortable, place a pillow under your low belly/hips to make sure your low back feel comfortable.   2. Place our forehead on top of your palms.      Widen your knees apart for starting position.   3. Inhale, feel belly and low back expand  4. Exhale, feel belly hug in, press heel together and count aloud for 5 sec. Then relax the heel squeezing.  Perform 10 reps of 5 sec holds. 2 sets/ day.    If you feel entire buttock tighten too much or feel low back pain, apply 50% less effort. As you press your heel together, you will feel as if your pubic bone (front of your pelvis) and sacrum (back of your pelvis) gentle move towards each other or your low abdominal muscles hug in more.        Continue with Deep core level 2 with emphasize on lengthening (lowering) pelvic floor with inhalation.    Wear shoes with lowered heels with good sole support for 5 work days.     Decrease tightening pelvic floor constantly. Focus on breathing   Sidesleeping: with pillow between knees  And pelvic neutral    Seated at work: Elbow circles  Diaphragmatic/ pelvic floor expansion on inhale and subtle lift with exhale

## 2016-01-17 NOTE — Therapy (Signed)
[place] MAIN [facility] SERVICES [address] [place], [place], [address] Phone: [phone]   Fax:  [phone]  Physical Therapy Treatment  Patient Details  Name: Madison Evans MRN: [id number] Date of Birth: 04/18/1961 Referring Provider: Matilde Sprang  Encounter Date: 01/17/2016    Past Medical History  Diagnosis Date  . MVP (mitral valve prolapse)   . Abnormal Pap smear of cervix 11/97    LEEP, CIN III  . Vertigo   . Acid reflux     Past Surgical History  Procedure Laterality Date  . Colposcopy  11/97    CIN III  . Cervical biopsy  w/ loop electrode excision  11/97    CIN III  . Hysteroscopy  02/2003    benign polyps    There were no vitals filed for this visit.      Subjective Assessment - 01/17/16 1710    Subjective Pt reported she has had urinary leakage every day since last session. Pt also noticed her low back pain from last session has resolved. She noticed pressure feeling situated more along her SIJ (pt points to the level of PSIS).  Upon further questioning, pt wears high heels to work everyday   Pertinent History Hx of a fall onto her tailbone after a cheerleading routine. Hx of vaginal procedures to remove hematoma after vaginal delivery in 1985 and to remove a polyp from a reproductive organ which pt can not recall.    Patient Stated Goals to avoid medications for urinary issues and gain bladder control, improve posture, pain, and sleep             [facility] PT Assessment - 01/17/16 1813    Palpation   SI assessment  PSIS symmetrical but tenderness and increased tensions along R sacrotuberous ligament                   Pelvic Floor Special Questions - 01/17/16 1811    Pelvic Floor Internal Exam Pt consented verbally without contraindication    Exam Type Vaginal   Palpation increased R iliococcygeus mm. decreased post Tx. Initial contraction was more anterior and post Tx : pt was able to elicit more  circumferential contraction           [facility] Adult PT Treatment/Exercise - 01/17/16 1812    Therapeutic Activites    Therapeutic Activities --  see pt instructions   Neuro Re-ed    Neuro Re-ed Details  see pt instructions   Manual Therapy   Myofascial Release R sacrotuberous ligament   Internal Pelvic Floor MET on iliococcygeus, faciliation of to lengthen                       PT Long Term Goals - 01/11/16 2344    PT LONG TERM GOAL #1   Title Pt will decrease pad use from 2x/ day to < 1x /day on work day   in order to save money and improve QOL.    Time 12   Period Weeks   Status On-going   PT LONG TERM GOAL #2   Title Pt will report not avoiding drinking water when traveling on trips due to fear of not having access to public bathrooms. and report not sitting at the end of theatre seats for easy access to toilets in order to show improve control of bladder to enjoy social events.    Time 12   Period Weeks   Status On-going  PT LONG TERM GOAL #3   Title Pt will demo < 1.5 fingers width below sternum to improve DRA and decrease back pain to perform household chores.    Time 12   Period Weeks   Status On-going   PT LONG TERM GOAL #4   Title Pt demo proper sitting posture across 2 visits without cuing in order optimize deep core system and minimize futher risk of injury.    Time 12   Period Weeks   Status On-going   PT LONG TERM GOAL #5   Title Pt will demo symmetrical alignment of SIJ and normal mobility of ilia<> sacrum in standing hip flexion (Stork test)  on LLE  across 2 visits in order to improve bed mobility and sleep.    Time 12   Period Weeks   Status On-going   PT LONG TERM GOAL #6   Title Pt will report more symmterical urine stream in order to return to ADLs.    Time 12   Period Weeks   Status On-going   PT LONG TERM GOAL #7   Title Pt will decrease her PFDI score from 44% to < 34% in order to improve QOL and pelvic floor function   Time 12    Period Weeks   Status On-going   PT LONG TERM GOAL #8   Title Pt will decrease her PGQ score from 49% to < 25% in order to return to loaded activities such as lifting and exercising.   Time 12   Period Weeks   Status On-going   PT LONG TERM GOAL  #9   TITLE Pt will de crease her PSQI score from 29% to < 15% in order to improve sleep quality.   Status On-going               Plan - 01/17/16 1803    Clinical Impression Statement Pt reported resolved back pain since last visit. Pt had 50% improvement with the SIJ pressure feeling post Tx. Pt demo'd improved pelvic floor excursion with diaphragmatic excursion in hooklying and seated position with moderate cuing. Pt demo'd more circumferential contraction of the pelvic floor with intra manual Tx to release R coccygeus mm . Anticipate pt to improve her incontinence with education provided today on decreasing overactivity of pelvic floor mm and to decrease wearing of high heel shoes. Pt was explained research and associations of increased pelvic floor activation with these two factors. Pt will continue benefit from PT.     Rehab Potential Good   PT Frequency 1x / week   PT Duration 12 weeks   PT Treatment/Interventions ADLs/Self Care Home Management;Aquatic Therapy;Cryotherapy;Gait training;Moist Heat;Electrical Stimulation;Biofeedback;Stair training;Functional mobility training;Therapeutic activities;Therapeutic exercise;Balance training;Neuromuscular re-education;Patient/family education;Manual techniques;Scar mobilization;Passive range of motion;Taping   Consulted and Agree with Plan of Care Patient      Patient will benefit from skilled therapeutic intervention in order to improve the following deficits and impairments:  Decreased balance, Decreased coordination, Decreased mobility, Decreased safety awareness, Decreased activity tolerance, Decreased endurance, Decreased range of motion, Decreased strength, Difficulty walking, Hypomobility,  Hypermobility, Pain, Improper body mechanics, Postural dysfunction  Visit Diagnosis: Bilateral low back pain without sciatica  Mixed incontinence  Weakness generalized  Unspecified lack of coordination     Problem List Patient Active Problem List   Diagnosis Date Noted  . History of migraine headaches 10/01/2014  . History of abnormal cervical Pap smear 10/26/2013  . Urge incontinence of urine 10/26/2013    Jerl Mina ,PT, DPT, E-RYT  01/17/2016, 6:14 PM  [place] MAIN [facility] SERVICES [address] [place], [place], [address] Phone: [phone]   Fax:  [phone]  Name: AMRIE GURGANUS MRN: [id number] Date of Birth: [birth year]

## 2016-01-20 ENCOUNTER — Encounter: Payer: Self-pay | Admitting: Nurse Practitioner

## 2016-01-20 NOTE — Telephone Encounter (Signed)
Responded to patient via mychart.   Routing to Lone ElmPatty to review and will close.

## 2016-01-24 ENCOUNTER — Ambulatory Visit: Payer: BLUE CROSS/BLUE SHIELD | Admitting: Physical Therapy

## 2016-01-24 DIAGNOSIS — M545 Low back pain, unspecified: Secondary | ICD-10-CM

## 2016-01-24 DIAGNOSIS — N3946 Mixed incontinence: Secondary | ICD-10-CM

## 2016-01-24 DIAGNOSIS — R279 Unspecified lack of coordination: Secondary | ICD-10-CM

## 2016-01-24 DIAGNOSIS — R531 Weakness: Secondary | ICD-10-CM

## 2016-01-24 DIAGNOSIS — M25512 Pain in left shoulder: Secondary | ICD-10-CM

## 2016-01-24 DIAGNOSIS — M25511 Pain in right shoulder: Secondary | ICD-10-CM

## 2016-01-24 NOTE — Patient Instructions (Addendum)
Seated "w" with yellow band  10 x 2 reps    Functional activities to minimize sacroiliac joint pain: (handout) for house chores)  Gardening: half kneeling  Drawer opening: squat or half kneeling   Sleeping with a rolled towel under side ribs if body sinks in soft mattress

## 2016-01-25 NOTE — Therapy (Signed)
[facility] MAIN [facility] SERVICES [address] [place], [place], [address] Phone: [phone]   Fax:  [phone]  Physical Therapy Treatment  Patient Details  Name: Madison Evans MRN: [id number] Date of Birth: 1961/04/01 Referring Provider: Sherron Monday  Encounter Date: 01/24/2016      PT End of Session - 01/25/16 2210    Visit Number 4   Number of Visits 12   Date for PT Re-Evaluation 03/15/16   PT Start Time 1705   PT Stop Time 1800   PT Time Calculation (min) 55 min   Activity Tolerance Patient tolerated treatment well   Behavior During Therapy [facility] for tasks assessed/performed      Past Medical History  Diagnosis Date  . MVP (mitral valve prolapse)   . Abnormal Pap smear of cervix 11/97    LEEP, CIN III  . Vertigo   . Acid reflux     Past Surgical History  Procedure Laterality Date  . Colposcopy  11/97    CIN III  . Cervical biopsy  w/ loop electrode excision  11/97    CIN III  . Hysteroscopy  02/2003    benign polyps    There were no vitals filed for this visit.      Subjective Assessment - 01/24/16 1710    Subjective Pt did not have urinary leakage this past week. Pt had minimal leakage with walking 2 miles. Pt noticed her urge has not been affected by the rain like usually does. Pt reported she bought shoe shopping and bought low heeled shoes. Pt tweaked her back with moving boxes and was able to relieve the pain after 24 hrs with a roller. Advil did not help with her pain. Pt reported she is more mindful with toileting posture which has helped with her urinating. Pt's urine stream is more symmtrical. Pt continues to complain about her shoulder pain and not being able to lay on her side.     Pertinent History Hx of a fall onto her tailbone after a cheerleading routine. Hx of vaginal procedures to remove hematoma after vaginal delivery in 1985 and to remove a polyp from a reproductive organ which pt can not recall.    Patient  Stated Goals to avoid medications for urinary issues and gain bladder control, improve posture, pain, and sleep             [facility] PT Assessment - 01/25/16 2208    Other:   Other/ Comments gardening and work positions on the floor showed counternutation of sacrum . Pt was explained how to modify positions to maintain force closure of pelvic girdle to minimize back pain   Palpation   SI assessment  PSIS symmetrical but tenderness and increased tensions along R sacrotuberous ligament                      [facility] Adult PT Treatment/Exercise - 01/25/16 2210    Therapeutic Activites    Therapeutic Activities --  see pt instructions   Neuro Re-ed    Neuro Re-ed Details  see pt instructions                PT Education - 01/25/16 2210    Education provided Yes   Education Details HEP   Person(s) Educated Patient   Methods Demonstration;Explanation;Tactile cues;Verbal cues;Handout   Comprehension Returned demonstration;Verbalized understanding             PT Long Term Goals - 01/24/16 1745  PT LONG TERM GOAL #1   Title Pt will decrease pad use from 2x/ day to < 1x /day on work day   in order to save money and improve QOL.    Time 12   Period Weeks   Status On-going   PT LONG TERM GOAL #2   Title Pt will report not avoiding drinking water when traveling on trips due to fear of not having access to public bathrooms. and report not sitting at the end of theatre seats for easy access to toilets in order to show improve control of bladder to enjoy social events.    Time 12   Period Weeks   Status On-going   PT LONG TERM GOAL #3   Title Pt will demo < 1.5 fingers width below sternum to improve DRA and decrease back pain to perform household chores.    Time 12   Period Weeks   Status Achieved   PT LONG TERM GOAL #4   Title Pt demo proper sitting posture across 2 visits without cuing in order optimize deep core system and minimize futher risk of injury.     Time 12   Period Weeks   Status Achieved   PT LONG TERM GOAL #5   Title Pt will demo symmetrical alignment of SIJ and normal mobility of ilia<> sacrum in standing hip flexion (Stork test)  on LLE  across 2 visits in order to improve bed mobility and sleep.    Time 12   Period Weeks   Status Achieved   Additional Long Term Goals   Additional Long Term Goals Yes   PT LONG TERM GOAL #6   Title Pt will report more symmterical urine stream in order to return to ADLs.    Time 12   Period Weeks   Status Achieved   PT LONG TERM GOAL #7   Title Pt will decrease her PFDI score from 44% to < 34% in order to improve QOL and pelvic floor function   Time 12   Period Weeks   Status On-going   PT LONG TERM GOAL #8   Title Pt will decrease her PGQ score from 49% to < 25% in order to return to loaded activities such as lifting and exercising.   Time 12   Period Weeks   Status On-going   PT LONG TERM GOAL  #9   TITLE Pt will de crease her PSQI score from 29% to < 15% in order to improve sleep quality. (01/24/16: pt uses a pillow between legs)    Status On-going   PT LONG TERM GOAL  #10   TITLE Pt will report decreased shoulder pain by > 50% and has been able to lay on her side when sleeping.    Time 12   Period Weeks   Status New               Plan - 01/25/16 2211    Clinical Impression Statement  Pt has achieved 4/9 goals across the past 4 visits. Pt reported she had significantly decreased urine leakage after walking 2 miles (no urine running down her leg). Pt also reported her urine stream has become more symmetrical.  Pt showed resolved diastasis recti, more symmetrically aligned pelvic girdle, decreased pelvic floor mm tensions,  improved deep core mm coordination/ strength, along with body mechanics with functional activities that involve loading the spine/ pelvis. Pt continues to progress well towards her remaining goals. Pt continues to complain about her shoulders limiting  her  ability to sleep on her side and other additional functional activities. Added these body parts as additional Dx to treat on re-cert. Thank you for your referral. I will continue to update on her progress.    Rehab Potential Good   PT Frequency 1x / week   PT Duration 12 weeks   PT Treatment/Interventions ADLs/Self Care Home Management;Aquatic Therapy;Cryotherapy;Gait training;Moist Heat;Electrical Stimulation;Biofeedback;Stair training;Functional mobility training;Therapeutic activities;Therapeutic exercise;Balance training;Neuromuscular re-education;Patient/family education;Manual techniques;Scar mobilization;Passive range of motion;Taping   Consulted and Agree with Plan of Care Patient      Patient will benefit from skilled therapeutic intervention in order to improve the following deficits and impairments:  Decreased balance, Decreased coordination, Decreased mobility, Decreased safety awareness, Decreased activity tolerance, Decreased endurance, Decreased range of motion, Decreased strength, Difficulty walking, Hypomobility, Hypermobility, Pain, Improper body mechanics, Postural dysfunction  Visit Diagnosis: Bilateral low back pain without sciatica  Mixed incontinence  Unspecified lack of coordination  Weakness generalized  Pain in left shoulder  Pain in right shoulder     Problem List Patient Active Problem List   Diagnosis Date Noted  . History of migraine headaches 10/01/2014  . History of abnormal cervical Pap smear 10/26/2013  . Urge incontinence of urine 10/26/2013    Mariane MastersYeung,Shin Yiing ,PT, DPT, E-RYT   01/25/2016, 10:16 PM  [facility] MAIN [facility] SERVICES [address] [place], [place], [address] Phone: [phone]   Fax:  [phone]  Name: Madison Evans MRN: [id number] Date of Birth: March 27, 1961

## 2016-01-29 ENCOUNTER — Encounter: Payer: Self-pay | Admitting: Nurse Practitioner

## 2016-02-02 ENCOUNTER — Telehealth: Payer: Self-pay | Admitting: Emergency Medicine

## 2016-02-02 NOTE — Telephone Encounter (Signed)
-----   Message from Ria CommentPatricia Grubb, FNP sent at 01/31/2016  5:17 PM EDT ----- Please look at pt e mail about her bleeding.  I have forwarded this on to you to call [facility]  ----- Message -----    From: Patton SallesBrook E Amundson C Silva, MD    Sent: 01/31/2016  11:12 AM      To: Ria CommentPatricia Grubb, FNP  Hi Patty,   I reviewed the patient's chart.   I would like for her to have another FSH and estradiol.  Her levels last year looked like menopause.  If her current levels indicate menopause, I would like to proceed with ultrasound, potential sonohysterogram, and EMB.  Thanks!  Brook  ----- Message -----    From: Ria CommentPatricia Grubb, FNP    Sent: 01/30/2016  10:50 AM      To: Brook Rosalin HawkingE Amundson C Silva, MD  Please see phone note and pt comment on her withdrawal bleeding.  The menses prior to this I believe was in December.  She is [age] - so a little more concerned about the heavy bleeding  --- any concerns?? Need PUS and endo biopsy?

## 2016-02-02 NOTE — Telephone Encounter (Signed)
Message left to return call to Marvina Danner at [phone].    

## 2016-02-06 ENCOUNTER — Other Ambulatory Visit: Payer: Self-pay | Admitting: Obstetrics and Gynecology

## 2016-02-06 DIAGNOSIS — N912 Amenorrhea, unspecified: Secondary | ICD-10-CM

## 2016-02-06 DIAGNOSIS — Z113 Encounter for screening for infections with a predominantly sexual mode of transmission: Secondary | ICD-10-CM

## 2016-02-06 NOTE — Telephone Encounter (Signed)
Return call from patient.  She is no longer having vaginal bleeding.  She states last year she had vaginal bleeding that was "like a teenager" each month from June to December 2016 and then no vaginal bleeding until day 9 of 10 day provera course.  She state she felt better while taking the Provera.  Lab appointment scheduled for 02/10/16 per patient request for date.   Advised will call her back with recommendations from Dr. Edward JollySilva after we receive lab results.  Patient would like to have her screening HIV and Hep C labs at this time since she is having blood work drawn.   Routing to Dr. Edward JollySilva to review and for lab orders.

## 2016-02-06 NOTE — Telephone Encounter (Signed)
I will order FSH, estradiol, hep C aby, and HIV.

## 2016-02-09 ENCOUNTER — Ambulatory Visit: Payer: BLUE CROSS/BLUE SHIELD | Admitting: Physical Therapy

## 2016-02-10 ENCOUNTER — Other Ambulatory Visit (INDEPENDENT_AMBULATORY_CARE_PROVIDER_SITE_OTHER): Payer: BLUE CROSS/BLUE SHIELD

## 2016-02-10 DIAGNOSIS — N912 Amenorrhea, unspecified: Secondary | ICD-10-CM

## 2016-02-10 DIAGNOSIS — Z113 Encounter for screening for infections with a predominantly sexual mode of transmission: Secondary | ICD-10-CM

## 2016-02-11 LAB — HIV ANTIBODY (ROUTINE TESTING W REFLEX): HIV 1&2 Ab, 4th Generation: NONREACTIVE

## 2016-02-11 LAB — FOLLICLE STIMULATING HORMONE: FSH: 36.5 m[IU]/mL

## 2016-02-11 LAB — ESTRADIOL: Estradiol: 160 pg/mL

## 2016-02-11 LAB — HEPATITIS C ANTIBODY: HCV Ab: NEGATIVE

## 2016-02-16 ENCOUNTER — Ambulatory Visit: Payer: BLUE CROSS/BLUE SHIELD | Attending: Urology | Admitting: Physical Therapy

## 2016-02-16 DIAGNOSIS — N3946 Mixed incontinence: Secondary | ICD-10-CM | POA: Insufficient documentation

## 2016-02-16 DIAGNOSIS — M25511 Pain in right shoulder: Secondary | ICD-10-CM | POA: Diagnosis present

## 2016-02-16 DIAGNOSIS — M545 Low back pain, unspecified: Secondary | ICD-10-CM

## 2016-02-16 DIAGNOSIS — R279 Unspecified lack of coordination: Secondary | ICD-10-CM | POA: Diagnosis present

## 2016-02-16 DIAGNOSIS — M25512 Pain in left shoulder: Secondary | ICD-10-CM | POA: Insufficient documentation

## 2016-02-16 DIAGNOSIS — R531 Weakness: Secondary | ICD-10-CM | POA: Diagnosis present

## 2016-02-16 NOTE — Therapy (Addendum)
[facility] MAIN [facility] SERVICES [address] [place], [place], [address] Phone: [phone]   Fax:  [phone]  Physical Therapy Treatment  Patient Details  Name: Madison Evans MRN: [id number] Date of Birth: Jun 20, 1961 Referring Provider: Sherron Monday  Encounter Date: 02/16/2016      PT End of Session - 02/16/16 1517    Visit Number 5   Number of Visits 12   Date for PT Re-Evaluation 03/15/16   PT Start Time 1440   PT Stop Time 1520   PT Time Calculation (min) 40 min   Activity Tolerance Patient tolerated treatment well   Behavior During Therapy [facility] for tasks assessed/performed      Past Medical History  Diagnosis Date  . MVP (mitral valve prolapse)   . Abnormal Pap smear of cervix 11/97    LEEP, CIN III  . Vertigo   . Acid reflux     Past Surgical History  Procedure Laterality Date  . Colposcopy  11/97    CIN III  . Cervical biopsy  w/ loop electrode excision  11/97    CIN III  . Hysteroscopy  02/2003    benign polyps    There were no vitals filed for this visit.      Subjective Assessment - 02/16/16 1449    Subjective Pt reported she has returned to fitness routine and has lost 7 pounds. Pt has been able to walk longer distances around the block without leakage and urge. her drives to work has also improved with less urge. Pt's sleep quality has improved and she sleeps on her back now without back pain. LBP has improved 60-70%.Pt reported her sleep study was done 9 yrs ago but had no f/u. Pt reports that her nocturia has decreased from 2x/ night to 1x/ night but she still does get up in the middle of the night despite not drinking alot of water prior to bed and her husband will awaken her when he hears that she has been holding her breath. Pt also stated she has mitral vale prolapse.      Pertinent History Hx of a fall onto her tailbone after a cheerleading routine. Hx of vaginal procedures to remove hematoma after vaginal  delivery in 1985 and to remove a polyp from a reproductive organ which pt can not recall.    Patient Stated Goals to avoid medications for urinary issues and gain bladder control, improve posture, pain, and sleep             [facility] PT Assessment - 02/16/16 1506    Observation/Other Assessments   Observations Less forward head with shoulders IR      Other:   Other/ Comments deep core coordination with fitness routine and not tighten belly constantly    Special Tests    Special Tests --  posterior GH joint pain at 100 deg shoulder abd L>R   Lift-Off test   Findings Negative   Empty Can test   Findings Negative   Full Can test   Findings Negative                     [facility] Adult PT Treatment/Exercise - 02/16/16 1632    Therapeutic Activites    Therapeutic Activities --  discussed importance of getting an updated sleep study    Exercises   Exercises --  see pt instructions                PT Education -  02/16/16 1517    Education provided Yes   Education Details HEP   Person(s) Educated Patient   Methods Explanation;Demonstration;Verbal cues;Tactile cues;Handout   Comprehension Returned demonstration;Verbalized understanding             PT Long Term Goals - 02/16/16 1452    PT LONG TERM GOAL #1   Title Pt will decrease pad use from 2x/ day to < 1x /day on work day   in order to save money and improve QOL.    Time 12   Period Weeks   Status Achieved   PT LONG TERM GOAL #2   Title Pt will report not avoiding drinking water when traveling on trips due to fear of not having access to public bathrooms. and report not sitting at the end of theatre seats for easy access to toilets in order to show improve control of bladder to enjoy social events.    Time 12   Period Weeks   Status Achieved   PT LONG TERM GOAL #3   Title Pt will demo < 1.5 fingers width below sternum to improve DRA and decrease back pain to perform household chores.    Time 12    Period Weeks   Status Achieved   PT LONG TERM GOAL #4   Title Pt demo proper sitting posture across 2 visits without cuing in order optimize deep core system and minimize futher risk of injury.    Time 12   Period Weeks   Status Achieved   PT LONG TERM GOAL #5   Title Pt will demo symmetrical alignment of SIJ and normal mobility of ilia<> sacrum in standing hip flexion (Stork test)  on LLE  across 2 visits in order to improve bed mobility and sleep.    Time 12   Period Weeks   Status Achieved   PT LONG TERM GOAL #6   Title Pt will report more symmterical urine stream in order to return to ADLs.    Time 12   Period Weeks   Status Achieved   PT LONG TERM GOAL #7   Title Pt will decrease her PFDI score from 44% to < 34% in order to improve QOL and pelvic floor function   Time 12   Period Weeks   Status On-going   PT LONG TERM GOAL #8   Title Pt will decrease her PGQ score from 49% to < 25% in order to return to loaded activities such as lifting and exercising.   Time 12   Period Weeks   Status On-going   PT LONG TERM GOAL  #9   TITLE Pt will de crease her PSQI score from 29% to < 15% in order to improve sleep quality. (01/24/16: pt uses a pillow between legs)    Status On-going   PT LONG TERM GOAL  #10   TITLE Pt will report decreased shoulder pain by > 50% and has been able to lay on her side when sleeping.    Time 12   Period Weeks   Status New               Plan - 02/16/16 1517    Clinical Impression Statement Pt reports being able to walk longer distances and driving to work without leakage nor urge. Pt is progressing well and demo'd no difficulty with posterior back and rotator cuff strengthening exercises that were introduced today.  Discussed with pt the importance of getting an updated sleep study to r/o or rule in  OSA. She has multiple factors that place her at risk for OSA: nocturia, her husband wakes her up when he hears her "holding her breath", and mitral valva  prolapse. PT will notify PCP re: this issue. Pt voiced understanding about f/u with a more updated sleep study.     Rehab Potential Good   PT Frequency 1x / week   PT Duration 12 weeks   PT Treatment/Interventions ADLs/Self Care Home Management;Aquatic Therapy;Cryotherapy;Gait training;Moist Heat;Electrical Stimulation;Biofeedback;Stair training;Functional mobility training;Therapeutic activities;Therapeutic exercise;Balance training;Neuromuscular re-education;Patient/family education;Manual techniques;Scar mobilization;Passive range of motion;Taping   Consulted and Agree with Plan of Care Patient      Patient will benefit from skilled therapeutic intervention in order to improve the following deficits and impairments:  Decreased balance, Decreased coordination, Decreased mobility, Decreased safety awareness, Decreased activity tolerance, Decreased endurance, Decreased range of motion, Decreased strength, Difficulty walking, Hypomobility, Hypermobility, Pain, Improper body mechanics, Postural dysfunction  Visit Diagnosis: Bilateral low back pain without sciatica  Mixed incontinence  Unspecified lack of coordination  Weakness generalized  Pain in left shoulder  Pain in right shoulder     Problem List Patient Active Problem List   Diagnosis Date Noted  . History of migraine headaches 10/01/2014  . History of abnormal cervical Pap smear 10/26/2013  . Urge incontinence of urine 10/26/2013    Mariane Masters ,PT, DPT, E-RYT  02/16/2016, 4:32 PM  [place] [facility] MAIN [facility] SERVICES [address] [place], [place], [address] Phone: [phone]   Fax:  [phone]  Name: Madison Evans MRN: [id number] Date of Birth: [birth year]

## 2016-02-16 NOTE — Patient Instructions (Signed)
Bridging series w/ resistive band other side of doorknob:  Level 1:  Position:  Elbows bent, knees hip width apart, heels on chair or stool/  Stabilization points: shoulders, upper arms, back of head pressed into floor. Heel press downward.   Movement: inhale do nothing, exhale pull band by side, lower fists to floor completely while lifting hips.Keep stabilization points engaged when you allow the band to go back to starting position  10 x 2 reps       Level 2:  Position:  Elbows straight, arms raised to ceiling at shoulder height, knees apart like a ballerina,heels together on chair or stool   Stabilization points: shoulders, upper arms, back of head pressed into floor. Heel press downward.   Movement: inhale do nothing, exhale pull band by side, lower fists to floor completely while lifting hips. Keep stabilization points engaged when you allow the band to go back to starting position   10 x 2 reps     ____________________  External rotation at yellow band doorknob with towel under elbow  10 x     Shoulder squeezes and chin tuck while driving / at a meeting 5 sec holds, x 5 reps

## 2016-02-22 ENCOUNTER — Telehealth: Payer: Self-pay | Admitting: Physical Therapy

## 2016-02-22 NOTE — Telephone Encounter (Signed)
PT left a msg with RN at Dr. Suzi RootsSparks's office (pt's PCP) that pt may benefit from an updated sleep study based on her risk factors.

## 2016-02-23 ENCOUNTER — Encounter: Payer: BLUE CROSS/BLUE SHIELD | Admitting: Physical Therapy

## 2016-03-02 ENCOUNTER — Ambulatory Visit: Payer: BLUE CROSS/BLUE SHIELD | Admitting: Physical Therapy

## 2016-03-02 DIAGNOSIS — M545 Low back pain, unspecified: Secondary | ICD-10-CM

## 2016-03-02 DIAGNOSIS — M25511 Pain in right shoulder: Secondary | ICD-10-CM

## 2016-03-02 DIAGNOSIS — M25512 Pain in left shoulder: Secondary | ICD-10-CM

## 2016-03-02 DIAGNOSIS — N3946 Mixed incontinence: Secondary | ICD-10-CM

## 2016-03-02 DIAGNOSIS — R279 Unspecified lack of coordination: Secondary | ICD-10-CM

## 2016-03-02 DIAGNOSIS — R531 Weakness: Secondary | ICD-10-CM

## 2016-03-02 NOTE — Therapy (Signed)
[facility] [address] [place], [place], [address] Phone: [phone]   Fax:  [phone]  Physical Therapy Treatment  Patient Details  Name: Madison Evans MRN: [id number] Date of Birth: [birth year] Referring Provider: Sherron MondayMacDiarmid  Encounter Date: 03/02/2016      PT End of Session - 03/02/16 0917    Visit Number 6   Number of Visits 12   Date for PT Re-Evaluation 03/15/16   PT Start Time 0805   PT Stop Time 0907   PT Time Calculation (min) 62 min   Activity Tolerance Patient tolerated treatment well   Behavior During Therapy [facility] for tasks assessed/performed      Past Medical History  Diagnosis Date  . MVP (mitral valve prolapse)   . Abnormal Pap smear of cervix 11/97    LEEP, CIN III  . Vertigo   . Acid reflux     Past Surgical History  Procedure Laterality Date  . Colposcopy  11/97    CIN III  . Cervical biopsy  w/ loop electrode excision  11/97    CIN III  . Hysteroscopy  02/2003    benign polyps    There were no vitals filed for this visit.      Subjective Assessment - 03/02/16 0812    Subjective Pt reported she has not minimal leakage for a month. Two days ago, pt's menstrual cycle came as expected after taking Provera (progesterone) as prescribed by her GYN MD. Pt noticed lowered position of her pelvic organs when taking a shower. Pt has not been able to keep her tampons in position. Pt also had noticed her back feeling "tweaked" after picking up 54yo grand dtr  over the weekend.     Pertinent History Hx of a fall onto her tailbone after a cheerleading routine. Hx of vaginal procedures to remove hematoma after vaginal delivery in 1985 and to remove a polyp from a reproductive organ which pt can not recall.    Patient Stated Goals to avoid medications for urinary issues and gain bladder control, improve posture, pain, and sleep             [facility] PT Assessment - 03/02/16 0907    Observation/Other  Assessments   Observations increased shoulder ROM and good carry over with postural alignment and deep core engagement in HEP                   Pelvic Floor Special Questions - 03/02/16 0848    Diastasis Recti 1 fingers width    Prolapse Other   Prolapse other  slightly lowered position of three pelvic organs but within introitus in hooklying and standing, Pt able to elicit lift of vaginal walls with cue for pelvic neutral and exhalation/ contraction    Pelvic Floor Internal Exam Pt consented verbally without contraindication    Exam Type Vaginal   Palpation no increased mm tensions    Strength good squeeze, good lift, able to hold agaisnt strong resistance   Strength # of reps 5   Strength # of seconds 10           [facility] Adult PT Treatment/Exercise - 03/02/16 0907    Neuro Re-ed    Neuro Re-ed Details  see pt instructions   Manual Therapy   Internal Pelvic Floor cued for pelvic floor contractions   Kinesiotex --  over sacrum  PT Education - 03/02/16 [phone]    Education provided Yes   Person(s) Educated Patient   Methods Explanation;Demonstration;Tactile cues;Verbal cues;Handout   Comprehension Returned demonstration;Verbalized understanding             PT Long Term Goals - 02/16/16 1452    PT LONG TERM GOAL #1   Title Pt will decrease pad use from 2x/ day to < 1x /day on work day   in order to save money and improve QOL.    Time 12   Period Weeks   Status Achieved   PT LONG TERM GOAL #2   Title Pt will report not avoiding drinking water when traveling on trips due to fear of not having access to public bathrooms. and report not sitting at the end of theatre seats for easy access to toilets in order to show improve control of bladder to enjoy social events.    Time 12   Period Weeks   Status Achieved   PT LONG TERM GOAL #3   Title Pt will demo < 1.5 fingers width below sternum to improve DRA and decrease back pain to perform  household chores.    Time 12   Period Weeks   Status Achieved   PT LONG TERM GOAL #4   Title Pt demo proper sitting posture across 2 visits without cuing in order optimize deep core system and minimize futher risk of injury.    Time 12   Period Weeks   Status Achieved   PT LONG TERM GOAL #5   Title Pt will demo symmetrical alignment of SIJ and normal mobility of ilia<> sacrum in standing hip flexion (Stork test)  on LLE  across 2 visits in order to improve bed mobility and sleep.    Time 12   Period Weeks   Status Achieved   PT LONG TERM GOAL #6   Title Pt will report more symmterical urine stream in order to return to ADLs.    Time 12   Period Weeks   Status Achieved   PT LONG TERM GOAL #7   Title Pt will decrease her PFDI score from 44% to < 34% in order to improve QOL and pelvic floor function   Time 12   Period Weeks   Status On-going   PT LONG TERM GOAL #8   Title Pt will decrease her PGQ score from 49% to < 25% in order to return to loaded activities such as lifting and exercising.   Time 12   Period Weeks   Status On-going   PT LONG TERM GOAL  #9   TITLE Pt will de crease her PSQI score from 29% to < 15% in order to improve sleep quality. (01/24/16: pt uses a pillow between legs)    Status On-going   PT LONG TERM GOAL  #10   TITLE Pt will report decreased shoulder pain by > 50% and has been able to lay on her side when sleeping.    Time 12   Period Weeks   Status New               Plan - 03/02/16 9604    Clinical Impression Statement Pt continues to report having minimal leakage over the past month and her shoulder pain has improved. Pt reported she has noticed firmness in her low abdominal muscle as she continues to perform her HEP and avoiding crunches and sit up.  However, pt reported she has noticed a lowered position of her pelvic organs  and increased back pain with the onset of her menstrual cycle and repeated lifting of granddtr. . Asessment showed  slightly lowered position of pelvic organs within introitus in hooklying and standing position. Pt was able to demo more cranial position of pelvic organs with pelvic alignment and pelvic floor endurance training.  Provided education to be cautious and not to over tighten pelvic floor mm to avoid relapse of overactivity of pelvic floor mm and leakage/urge. Pt's back pain decreased with prone heel press exercise. Pt showed no pelvic obliquities and suspect that pt's hormonal changes and repeated lifting may be influencing her relapse of LBP and lowered position of her pelvic organs. Plan to monitor pelvic organs descent and to progress strengthening exercises.    Rehab Potential Good   PT Frequency 1x / week   PT Duration 12 weeks   PT Treatment/Interventions ADLs/Self Care Home Management;Aquatic Therapy;Cryotherapy;Gait training;Moist Heat;Electrical Stimulation;Biofeedback;Stair training;Functional mobility training;Therapeutic activities;Therapeutic exercise;Balance training;Neuromuscular re-education;Patient/family education;Manual techniques;Scar mobilization;Passive range of motion;Taping   Consulted and Agree with Plan of Care Patient      Patient will benefit from skilled therapeutic intervention in order to improve the following deficits and impairments:  Decreased balance, Decreased coordination, Decreased mobility, Decreased safety awareness, Decreased activity tolerance, Decreased endurance, Decreased range of motion, Decreased strength, Difficulty walking, Hypomobility, Hypermobility, Pain, Improper body mechanics, Postural dysfunction  Visit Diagnosis: Bilateral low back pain without sciatica  Unspecified lack of coordination  Mixed incontinence  Weakness generalized  Pain in left shoulder  Pain in right shoulder     Problem List Patient Active Problem List   Diagnosis Date Noted  . History of migraine headaches 10/01/2014  . History of abnormal cervical Pap smear  10/26/2013  . Urge incontinence of urine 10/26/2013    Mariane MastersYeung,Shin Yiing ,PT, DPT, E-RYT  03/02/2016, 1:58 PM   [facility] [address] [place], [place], [address] Phone: [phone]   Fax:  [phone]  Name: Madison Evans MRN: [id number] Date of Birth: 1961-07-24

## 2016-03-02 NOTE — Patient Instructions (Signed)
Modifications to birddog:  Toes tucked, spider woman hands Exhale, zip the zipper to engage multifidis Thumbs up  Imaginary plate on your back    Extended side angle:  Parallel tracks not heel to heel line.  Keep back toes turned ~45 deg instead 90 degs  Slice the air with upper arm instead just lifting  Elongate lower side trunk, instead of putting all weight on bottom elbow   _____________________  Continue with prone heel press for sacroiliac joint hugginess and also band exercises ( bridging on bench)   _____________________  PELVIC FLOOR / KEGEL EXERCISES   Pelvic floor/ Kegel exercises are used to strengthen the muscles in the base of your pelvis that are responsible for supporting your pelvic organs and preventing urine/feces leakage. Based on your therapist's recommendations, they can be performed while standing, sitting, or lying down. Imagine pelvic floor area as a diamond with pelvic landmarks: top =pubic bone, bottom tip=tailbone, sides=sitting bones (ischial tuberosities).    Make yourself aware of this muscle group by using these cues while coordinating your breath:  Inhale, feel pelvic floor diamond area lower like hammock towards your feet and ribcage/belly expanding. Pause. Let the exhale naturally and feel your belly sink, abdominal muscles hugging in around you and you may notice the pelvic diamond draws upward towards your head forming a umbrella shape. Give a squeeze during the exhalation like you are stopping the flow of urine. If you are squeezing the buttock muscles, try to give 50% less effort.   Common Errors:  Breath holding: If you are holding your breath, you may be bearing down against your bladder instead of pulling it up. If you belly bulges up while you are squeezing, you are holding your breath. Be sure to breathe gently in and out while exercising. Counting out loud may help you avoid holding your breath.  Accessory muscle use: You should not  see or feel other muscle movement when performing pelvic floor exercises. When done properly, no one can tell that you are performing the exercises. Keep the buttocks, belly and inner thighs relaxed.  Overdoing it: Your muscles can fatigue and stop working for you if you over-exercise. You may actually leak more or feel soreness at the lower abdomen or rectum.  YOUR HOME EXERCISE PROGRAM  LONG HOLDS: Position: on back with pillow under hips  Inhale and then exhale. Then squeeze the muscle and count aloud for 10 seconds. Rest with three long breaths. (Be sure to let belly sink in with exhales and not push outward)  Perform 5 repetitions, 3 times/day  SHORT HOLDS: Position: on back, sitting , standing   Inhale and then exhale. Then squeeze the muscle.  (Be sure to let belly sink in with exhales and not push outward)  Perform 5 repetitions, 5  Times/day                      DECREASE DOWNWARD PRESSURE ON  YOUR PELVIC FLOOR, ABDOMINAL, LOW BACK MUSCLES       PRESERVE YOUR PELVIC HEALTH LONG-TERM   ** SQUEEZE pelvic floor BEFORE YOUR SNEEZE, COUGH, LAUGH   ** EXHALE BEFORE YOU RISE AGAINST GRAVITY (lifting, sit to stand, from squat to stand)   ** LOG ROLL OUT OF BED INSTEAD OF CRUNCH/SIT-UP

## 2016-03-23 ENCOUNTER — Encounter: Payer: BLUE CROSS/BLUE SHIELD | Admitting: Physical Therapy

## 2016-05-23 ENCOUNTER — Other Ambulatory Visit: Payer: Self-pay | Admitting: Internal Medicine

## 2016-05-23 DIAGNOSIS — Z1231 Encounter for screening mammogram for malignant neoplasm of breast: Secondary | ICD-10-CM

## 2016-05-30 ENCOUNTER — Ambulatory Visit: Payer: BLUE CROSS/BLUE SHIELD | Attending: Urology | Admitting: Physical Therapy

## 2016-05-30 ENCOUNTER — Encounter: Payer: Self-pay | Admitting: Physical Therapy

## 2016-05-30 ENCOUNTER — Ambulatory Visit: Payer: BLUE CROSS/BLUE SHIELD | Admitting: Physical Therapy

## 2016-05-30 DIAGNOSIS — M25511 Pain in right shoulder: Secondary | ICD-10-CM | POA: Insufficient documentation

## 2016-05-30 DIAGNOSIS — N3946 Mixed incontinence: Secondary | ICD-10-CM | POA: Insufficient documentation

## 2016-05-30 DIAGNOSIS — R531 Weakness: Secondary | ICD-10-CM

## 2016-05-30 DIAGNOSIS — M25512 Pain in left shoulder: Secondary | ICD-10-CM | POA: Insufficient documentation

## 2016-05-30 DIAGNOSIS — M545 Low back pain, unspecified: Secondary | ICD-10-CM

## 2016-05-30 DIAGNOSIS — M79622 Pain in left upper arm: Secondary | ICD-10-CM | POA: Diagnosis not present

## 2016-05-30 DIAGNOSIS — R279 Unspecified lack of coordination: Secondary | ICD-10-CM | POA: Insufficient documentation

## 2016-05-30 NOTE — Therapy (Addendum)
[place] [facility] MAIN [facility] SERVICES [address] [place], [place], [address] Phone: [phone]   Fax:  [phone]  Physical Therapy Treatment  Patient Details  Name: Madison Evans MRN: [id number] Date of Birth: 17-Aug-1961 Referring Provider: Kennedy Bucker  Encounter Date: 05/30/2016      PT End of Session - 05/30/16 1751    Visit Number 1   Number of Visits 8   Date for PT Re-Evaluation 07/25/16   Authorization Type blue cross blue shield   PT Start Time 1745   PT Stop Time 1830   PT Time Calculation (min) 45 min   Activity Tolerance Patient tolerated treatment well      Past Medical History:  Diagnosis Date  . Abnormal Pap smear of cervix 11/97   LEEP, CIN III  . Acid reflux   . MVP (mitral valve prolapse)   . Vertigo     Past Surgical History:  Procedure Laterality Date  . CERVICAL BIOPSY  W/ LOOP ELECTRODE EXCISION  11/97   CIN III  . COLPOSCOPY  11/97   CIN III  . HYSTEROSCOPY  02/2003   benign polyps    There were no vitals filed for this visit.      Subjective Assessment - 05/30/16 1748    Subjective Patient is having pain and stiffness in LUE when she moves it that started in June.    Pertinent History Hx of a fall onto her tailbone after a cheerleading routine. Hx of vaginal procedures to remove hematoma after vaginal delivery in 1985 and to remove a polyp from a reproductive organ which pt can not recall.    Patient Stated Goals To be able to use her arm better and have the pain.    Currently in Pain? Yes   Pain Score 1    Pain Location Shoulder   Pain Orientation Left   Pain Descriptors / Indicators Dull   Pain Type Chronic pain   Pain Onset More than a month ago   Pain Frequency Constant   Aggravating Factors  movement,    Pain Relieving Factors ice    Effect of Pain on Daily Activities unable to perform ADL's            [facility] PT Assessment - 05/30/16 1754      Assessment   Medical Diagnosis BUE  shoulder pain   Referring Provider [facility], MICHAEL   Onset Date/Surgical Date 02/02/16   Hand Dominance Right   Prior Therapy --  yes for womens health     Precautions   Precautions None     Restrictions   Weight Bearing Restrictions No     Balance Screen   Has the patient fallen in the past 6 months No   Has the patient had a decrease in activity level because of a fear of falling?  Yes   Is the patient reluctant to leave their home because of a fear of falling?  No     Home Environment   Living Environment Private residence   Living Arrangements Spouse/significant other   Available Help at Discharge Family   Type of Home House   Home Access Stairs to enter   Entrance Stairs-Number of Steps 3   Entrance Stairs-Rails Right;Left   Home Layout Two level   Alternate Level Stairs-Number of Steps 12   Alternate Level Stairs-Rails Left   Home Equipment None     Prior Function   Level of Independence Independent   Vocation Full  time employment   Print production plannerVocation Requirements computer, key board   Leisure walk , yard work, eat out     Cognition   Overall Cognitive Status Within Functional Limits for tasks assessed   Attention Focused        .PAIN:  1/10 to 8/10, left shoulder, 0/10 - 2/10 right shoulder  POSTURE: WNL   PROM/AROM: left shoulder abd 0-78 deg AROM Pain begins with shoulder abd 40 degs Left shoulder IR 50 deg Left shoulder ER 5 deg Left shoulder flex 115 deg Right shoulder flex 146 deg supine Right shoulder abd 146 deg supine Right shoulder ER 70 deg Right shoulder IR 60 deg  Grip strength right hand 24 lbs/ left strength 22 lbs  STRENGTH:  Graded on a 0-5 scale Muscle Group Left Right  Shoulder flex 3+/5 5/5  Shoulder Abd 4/5 5/5  Shoulder Ext 4/5 5/5  Shoulder IR/ER 5/5 5/5  Elbow 5/5 5/5  Wrist/hand 5/5 5/5                                       SENSATION: WFL   Manual therapy to thoracic spine T 1- T7 PA glide, grade 2 and 3 xx 3 sets 30  bouts  Patient had improved AROM to left shoulder for 78 abd 115 abd,  92 deg to 125 deg following therapy  Therapeutic exercise including instructions on pulley, chin tucks, and scapula retraction instruction          OUTCOME MEASURES: TEST Outcome Interpretation                               After manual therapy 125 abd and 135 flex left shoulder IR 60 deg ER 25 degs HEP scapula retraction 15 x 2 with YTB Chin tucks hold for 10 sec Pulley x 5 minutes         Pelvic Floor Special Questions - 05/30/16 1451    Diastasis Recti 1 fingers width    Pelvic Floor Internal Exam Pt consented verbally without contraindication    Exam Type Vaginal   Palpation no increased mm tensions    Strength good squeeze, good lift, able to hold agaisnt strong resistance   Strength # of reps 2   Strength # of seconds 10                   PT Education - 05/30/16 1751    Education provided Yes   Education Details plan of care   Person(s) Educated Patient   Methods Explanation   Comprehension Verbalized understanding             PT Long Term Goals - 05/31/16 1745      PT LONG TERM GOAL #1   Title Patient will report a worst pain of 3/10 on VAS in  left shoulder           to improve tolerance with ADLs and reduced symptoms with activities   Time 8   Period Weeks   Status New     PT LONG TERM GOAL #2   Title Patient will be independent in home exercise program to improve strength/mobility for better functional independence with ADLs   Time 8   Period Weeks   Status New     PT LONG TERM GOAL #3   Title Patient will demonstrate adequate shoulder  ROM and strength to be able to shave and dress independently with pain less than 3/10   Time 8   Period Weeks   Status New               Plan - 05/31/16 1610    Clinical Impression Statement Patient presents to PT with dx of bilateral shoulder pain. She has decreased ROM bilateral shoulders and pain in  mid range. She has weakness in LUE shoulder. She has hypomobility in thoracic spine T1-T7 and is tender to palpation in upper thoracic muscle and rhomboid muscle bilaterally.    Rehab Potential Good   PT Frequency 2x / week   PT Duration 12 weeks   PT Treatment/Interventions Cryotherapy;Moist Heat;Functional mobility training;Therapeutic activities;Therapeutic exercise;Patient/family education;Manual techniques;Passive range of motion;Iontophoresis 4mg /ml Dexamethasone;Electrical Stimulation   PT Next Visit Plan manual therapy to left shoulder, exercises to left shoulder, posture education   PT Home Exercise Plan scapula retraction wiht YTB, chin tuck, pulley   Consulted and Agree with Plan of Care Patient      Patient will benefit from skilled therapeutic intervention in order to improve the following deficits and impairments:  Decreased mobility, Decreased range of motion, Decreased strength, Hypermobility, Pain, Postural dysfunction, Impaired flexibility  Visit Diagnosis: Pain in left upper arm - Plan: PT plan of care cert/re-cert, CANCELED: PT plan of care cert/re-cert     Problem List Patient Active Problem List   Diagnosis Date Noted  . History of migraine headaches 10/01/2014  . History of abnormal cervical Pap smear 10/26/2013  . Urge incontinence of urine 10/26/2013    [name] 05/31/2016, 5:47 PM  [place] [facility] MAIN [facility] SERVICES [address] [place], [place], [address] Phone: [phone]   Fax:  [phone]  Name: Madison Evans MRN: [id number] Date of Birth: 1960/11/17

## 2016-05-30 NOTE — Patient Instructions (Addendum)
Pelvic floor exercises ( pillow under hips)  5 sec, 5 reps, 5 x  For 2 -3 weeks and then gradually to build up to 7 sec, 5 reps     soft jump if you return to classes  With quick squeeze and exhale     Walking with wider feet under hips, belly button over shin as you step.  Post walking stretches:  Add figure -4 and hip flexor twist( pelvic in place)     Variate getting your stepping with sidestepping , grapevine down a hallway

## 2016-05-30 NOTE — Patient Instructions (Signed)
Chin tuck x 10 sec x 10 reps Scapula retraction YTB x 15 x 2 daily Pulley x 5 minutes

## 2016-05-30 NOTE — Therapy (Signed)
[place] MAIN [facility] SERVICES [address] [place], [place], [address] Phone: [phone]   Fax:  [phone]  Physical Therapy Treatment / Discharge Summary   Patient Details  Name: Madison Evans MRN: [id number] Date of Birth: [birth year] Referring Provider: Matilde Sprang  Encounter Date: 05/30/2016      PT End of Session - 05/30/16 1459    Visit Number 7   Number of Visits 12   Date for PT Re-Evaluation 03/15/16   PT Start Time 3570   PT Stop Time 1500   PT Time Calculation (min) 56 min   Activity Tolerance Patient tolerated treatment well   Behavior During Therapy [facility] for tasks assessed/performed      Past Medical History:  Diagnosis Date  . Abnormal Pap smear of cervix 11/97   LEEP, CIN III  . Acid reflux   . MVP (mitral valve prolapse)   . Vertigo     Past Surgical History:  Procedure Laterality Date  . CERVICAL BIOPSY  W/ LOOP ELECTRODE EXCISION  11/97   CIN III  . COLPOSCOPY  11/97   CIN III  . HYSTEROSCOPY  02/2003   benign polyps    There were no vitals filed for this visit.      Subjective Assessment - 05/30/16 1411    Subjective Pt returnes to PT 2 months after her last appt with relapse of Sx following a return to boot camp class and moving heavy luggage. Pt 's back pain dissipated after moving heavy luggage within a day but the soreness lingers today.  Pt feels LBP and urnary leakage Sx return. Prior to relapse , pt had significantly decreased LBP and leakage with walking and decreased urgency with ability to ride in a car on a long road trip without excessive pit stops.     Pertinent History Hx of a fall onto her tailbone after a cheerleading routine. Hx of vaginal procedures to remove hematoma after vaginal delivery in 1985 and to remove a polyp from a reproductive organ which pt can not recall.    Patient Stated Goals to avoid medications for urinary issues and gain bladder control, improve posture, pain, and  sleep             [facility] PT Assessment - 05/30/16 1451      Ambulation/Gait   Gait Pattern --  posterior COM, narrow BOS, heel striking                  Pelvic Floor Special Questions - 05/30/16 1451    Diastasis Recti 1 fingers width    Pelvic Floor Internal Exam Pt consented verbally without contraindication    Exam Type Vaginal   Palpation no increased mm tensions    Strength good squeeze, good lift, able to hold agaisnt strong resistance   Strength # of reps 2   Strength # of seconds 10                   PT Education - 05/30/16 1459    Education provided Yes   Education Details HEP, d/c    Person(s) Educated Patient   Methods Explanation;Demonstration;Tactile cues;Verbal cues;Handout   Comprehension Returned demonstration;Verbalized understanding             PT Long Term Goals - 05/30/16 1409      PT LONG TERM GOAL #1   Title Pt will decrease pad use from 2x/ day to < 1x /day on work day   in  order to save money and improve QOL.    Time 12   Period Weeks   Status Achieved     PT LONG TERM GOAL #2   Title Pt will report not avoiding drinking water when traveling on trips due to fear of not having access to public bathrooms. and report not sitting at the end of theatre seats for easy access to toilets in order to show improve control of bladder to enjoy social events.    Time 12   Period Weeks   Status Achieved     PT LONG TERM GOAL #3   Title Pt will demo < 1.5 fingers width below sternum to improve DRA and decrease back pain to perform household chores.    Time 12   Period Weeks   Status Achieved     PT LONG TERM GOAL #4   Title Pt demo proper sitting posture across 2 visits without cuing in order optimize deep core system and minimize futher risk of injury.    Time 12   Period Weeks   Status Achieved     PT LONG TERM GOAL #5   Title Pt will demo symmetrical alignment of SIJ and normal mobility of ilia<> sacrum in standing hip  flexion (Stork test)  on LLE  across 2 visits in order to improve bed mobility and sleep.    Time 12   Period Weeks   Status Achieved     PT LONG TERM GOAL #6   Title Pt will report more symmterical urine stream in order to return to ADLs.    Time 12   Period Weeks   Status Achieved     PT LONG TERM GOAL #7   Title Pt will decrease her PFDI score from 44% to < 34% in order to improve QOL and pelvic floor function  (05/30/16: prior to relapse 29%)    Period Weeks   Status Achieved     PT LONG TERM GOAL #8   Title Pt will decrease her PGQ score from 49% to < 25% in order to return to loaded activities such as lifting and exercising.  (05/30/16: prior to relapse 23%)    Time 12   Period Weeks   Status Achieved     PT LONG TERM GOAL  #9   TITLE Pt will de crease her PSQI score from 29% to < 15% in order to improve sleep quality. (01/24/16: pt uses a pillow between legs, (05/30/16: prior to relapse 24%)    Period Weeks   Status Partially Met     PT LONG TERM GOAL  #10   TITLE Pt will report decreased shoulder pain by > 50% and has been able to lay on her side when sleeping.    Status Not Met               Plan - 05/30/16 1459    Clinical Impression Statement Pt has achieved 8/10 goals. Pt has improved with decreased urianry leakage and frequency with decreased score on PFDI from 44% to 29%.  She showed decreased LBP, resolved diastasis recti, no pelvic oblitiquites, improved coordination and strength of deep core mm, and increased pelvic floor endurance.  Pt  had a relapse of back pain after lifting heavy luggage but her pain  dissipated within 1 day which indicates her increased strength. The  relapse of her urinary leakage occurred with her return to boot camp class  despite PT's recommendation to wait until pt has learned how to  gradually  progress towards pylometric and other typcial fitness routines.  Today, pt  demo'd only decreased endurance of her pelvic floor and and  slightly  lowered position of her bladder while other previous deficits remained  corrected.  Pt was educated on gait and pelvic floor training today in  order to empower pt to continue to stay active and maintain her progress  through rehab. Pt has partially met her sleeping goal but has not achieved  her goal related to shoulder pain but these goals will be addressed with  another PT today on a separate MD order. Pt is ready for d/c at this time.   Pt voiced understanding on how to continue with her exercises following  d/c. Thank you for your referral!       Rehab Potential Good   PT Frequency 1x / week   PT Duration 12 weeks   PT Treatment/Interventions ADLs/Self Care Home Management;Aquatic Therapy;Cryotherapy;Gait training;Moist Heat;Electrical Stimulation;Biofeedback;Stair training;Functional mobility training;Therapeutic activities;Therapeutic exercise;Balance training;Neuromuscular re-education;Patient/family education;Manual techniques;Scar mobilization;Passive range of motion;Taping   Consulted and Agree with Plan of Care Patient      Patient will benefit from skilled therapeutic intervention in order to improve the following deficits and impairments:  Decreased balance, Decreased coordination, Decreased mobility, Decreased safety awareness, Decreased activity tolerance, Decreased endurance, Decreased range of motion, Decreased strength, Difficulty walking, Hypomobility, Hypermobility, Pain, Improper body mechanics, Postural dysfunction  Visit Diagnosis: Bilateral low back pain without sciatica  Unspecified lack of coordination  Mixed incontinence  Weakness generalized  Pain in left shoulder  Pain in right shoulder     Problem List Patient Active Problem List   Diagnosis Date Noted  . History of migraine headaches 10/01/2014  . History of abnormal cervical Pap smear 10/26/2013  . Urge incontinence of urine 10/26/2013    Jerl Mina ,PT, DPT,  E-RYT  05/30/2016, 4:35 PM  [place] MAIN [facility] SERVICES [address] [place], [place], [address] Phone: [phone]   Fax:  [phone]  Name: Madison Evans MRN: [id number] Date of Birth: April 20, 1961

## 2016-05-31 NOTE — Addendum Note (Signed)
Addended by: [name], Emmani Lesueur S on: 05/31/2016 09:08 AM   Modules accepted: Orders

## 2016-06-03 ENCOUNTER — Encounter: Payer: Self-pay | Admitting: Nurse Practitioner

## 2016-06-04 ENCOUNTER — Ambulatory Visit: Payer: BLUE CROSS/BLUE SHIELD | Attending: Urology | Admitting: Physical Therapy

## 2016-06-04 ENCOUNTER — Encounter: Payer: Self-pay | Admitting: Physical Therapy

## 2016-06-04 ENCOUNTER — Telehealth: Payer: Self-pay | Admitting: *Deleted

## 2016-06-04 DIAGNOSIS — M25511 Pain in right shoulder: Secondary | ICD-10-CM | POA: Diagnosis present

## 2016-06-04 DIAGNOSIS — M25512 Pain in left shoulder: Secondary | ICD-10-CM | POA: Diagnosis present

## 2016-06-04 DIAGNOSIS — G8929 Other chronic pain: Secondary | ICD-10-CM

## 2016-06-04 NOTE — Therapy (Addendum)
[facility] MAIN [facility] SERVICES [address] [place], [place], [address] Phone: [phone]   Fax:  [phone]  Physical Therapy Treatment  Patient Details  Name: Madison Evans MRN: [id number] Date of Birth: 06-28-1961 Referring Provider: Kennedy Bucker  Encounter Date: 06/04/2016      PT End of Session - 06/04/16 1740    Visit Number 2   Number of Visits 8   Date for PT Re-Evaluation 07/25/16   Authorization Type blue cross blue shield   PT Start Time 1745   PT Stop Time 1830   PT Time Calculation (min) 45 min   Activity Tolerance Patient tolerated treatment well      Past Medical History:  Diagnosis Date  . Abnormal Pap smear of cervix 11/97   LEEP, CIN III  . Acid reflux   . MVP (mitral valve prolapse)   . Vertigo     Past Surgical History:  Procedure Laterality Date  . CERVICAL BIOPSY  W/ LOOP ELECTRODE EXCISION  11/97   CIN III  . COLPOSCOPY  11/97   CIN III  . HYSTEROSCOPY  02/2003   benign polyps    There were no vitals filed for this visit.      Subjective Assessment - 06/04/16 1742    Subjective Patient is having pain and stiffness in LUE when she moves it that started in June.    Pertinent History Hx of a fall onto her tailbone after a cheerleading routine. Hx of vaginal procedures to remove hematoma after vaginal delivery in 1985 and to remove a polyp from a reproductive organ which pt can not recall.    Patient Stated Goals To be able to use her arm better and have the pain.    Pain Onset More than a month ago     AROM left shoulder pre and post 90 abd / 133 abd 118 flex/ 125 IR 50 / 40  ER 20/ 25 Manual therapy thoracic spine PA glides grade 3 and 4 T 1- T%, C4-  Roller stick x 5 minutes to upper traps bilaterally STM Manual therapy left shoulder inferior glide 30 deg , 60 deg  Therapeutic exercises: Shoulder abd supine Cane shoulder flex x 20 Cane horizontal abd/add x 20 Patient has increased L  shoulder abd/.flex/ ER following manual therpay.                           PT Education - 06/04/16 1740    Education provided Yes   Education Details HEP   Person(s) Educated Patient   Methods Explanation   Comprehension Verbalized understanding             PT Long Term Goals - 05/31/16 1745      PT LONG TERM GOAL #1   Title Patient will report a worst pain of 3/10 on VAS in  left shoulder           to improve tolerance with ADLs and reduced symptoms with activities   Time 8   Period Weeks   Status New     PT LONG TERM GOAL #2   Title Patient will be independent in home exercise program to improve strength/mobility for better functional independence with ADLs   Time 8   Period Weeks   Status New     PT LONG TERM GOAL #3   Title Patient will demonstrate adequate shoulder ROM and strength to be able to shave and  dress independently with pain less than 3/10   Time 8   Period Weeks   Status New               Plan - 06/04/16 1740    Rehab Potential Good   PT Frequency 2x / week   PT Duration 12 weeks   PT Treatment/Interventions Cryotherapy;Moist Heat;Functional mobility training;Therapeutic activities;Therapeutic exercise;Patient/family education;Manual techniques;Passive range of motion;Iontophoresis 4mg /ml Dexamethasone;Electrical Stimulation   PT Next Visit Plan manual therapy to left shoulder, exercises to left shoulder, posture education   PT Home Exercise Plan scapula retraction wiht YTB, chin tuck, pulley   Consulted and Agree with Plan of Care Patient      Patient will benefit from skilled therapeutic intervention in order to improve the following deficits and impairments:  Decreased mobility, Decreased range of motion, Decreased strength, Hypermobility, Pain, Postural dysfunction, Impaired flexibility  Visit Diagnosis: Chronic left shoulder pain  Chronic right shoulder pain     Problem List Patient Active Problem List    Diagnosis Date Noted  . History of migraine headaches 10/01/2014  . History of abnormal cervical Pap smear 10/26/2013  . Urge incontinence of urine 10/26/2013    [name], Filbert Craze S 06/05/2016, 9:14 AM  [facility] MAIN [facility] [address] [place], [place], [address] Phone: [phone]   Fax:  [phone]  Name: Madison Evans MRN: [id number] Date of Birth: 01/18/1961

## 2016-06-04 NOTE — Telephone Encounter (Signed)
Routing to Shirlyn GoltzPatty Grubb, FNP for review.

## 2016-06-04 NOTE — Telephone Encounter (Signed)
Non-Urgent Medical Question  Message [id number]  From Madison Evans To Madison CommentPatricia Grubb, FNP Sent 06/03/2016 10:14 AM  Hi! I've copied your 5/29 message below. After the provera challenge you gave me that I started 01/07/16, I had a period on 5/22 and another one on 6/28. I have not had one since then. Please let me know if I'm supposed to start the Provera again and if so, please [facility] [place] to be filled before Wednesday, 10/4 if at all possible since I'll be leaving to go out of town for the rest of the week. Also, about a month ago, I started experiencing a lot of hair loss when I wash it. Don't know if that's normal for my age or not, but thought I'd let you know. Thanks!   RE: Non-Urgent Medical Question  We need to know that if bleeding occurs and how much or if no bleeding for 3 months. We then do another Provera Challenge. It is very important to get any endometrial lining out so as not to put you at risk for endometrial cancer. I am concerned that you did bleed heavy on this last cycle. I will discuss with Dr. Edward JollySilva and call you if we need to evaluate in any other way.   Responsible Party   Pool - [facility] Clinical Pool No one has taken responsibility for this message.  No actions have been taken on this message.

## 2016-06-05 ENCOUNTER — Other Ambulatory Visit: Payer: Self-pay | Admitting: Nurse Practitioner

## 2016-06-05 MED ORDER — MEDROXYPROGESTERONE ACETATE 10 MG PO TABS: 10.0000 mg | ORAL_TABLET | Freq: Every day | ORAL | 0 refills | Status: DC

## 2016-06-05 NOTE — Patient Instructions (Addendum)
(  Clinic) Retraction: Row - Bilateral (Pulley)    Facing pulley, arms reaching forward, pull hands toward stomach, pinching shoulder blades together. Repeat ____ times per set. Do ____ sets per session. Do ____ sessions per week. Use ____ lb weights.  Copyright  VHI. All rights reserved.  Axial Extension    Gently pull chin in while lengthening back of neck. Hold ____ seconds. Repeat ____ times. Do ____ sessions per day.  http://gt2.exer.us/3   Copyright  VHI. All rights reserved.  Abduction (Eccentric) - Active (Cane)    Lift cane out to side with affected arm. Avoid hiking shoulder. Keep palm relaxed. Slowly lower affected arm for 3-5 seconds. ___ reps per set, ___ sets per day, ___ days per week. Add ___ lbs when you achieve ___ repetitions.  http://ecce.exer.us/165   Copyright  VHI. All rights reserved.

## 2016-06-05 NOTE — Telephone Encounter (Signed)
Yes she needs another Provera challenge.  Meds are sent to her pharmacy.  She does need to let us know about next withdrawal bleeding. Either no bleeding or any bleeding.

## 2016-06-06 ENCOUNTER — Encounter: Payer: Self-pay | Admitting: Physical Therapy

## 2016-06-06 ENCOUNTER — Ambulatory Visit: Payer: BLUE CROSS/BLUE SHIELD | Admitting: Physical Therapy

## 2016-06-06 DIAGNOSIS — M25512 Pain in left shoulder: Secondary | ICD-10-CM | POA: Diagnosis not present

## 2016-06-06 DIAGNOSIS — G8929 Other chronic pain: Secondary | ICD-10-CM

## 2016-06-06 DIAGNOSIS — M25511 Pain in right shoulder: Secondary | ICD-10-CM

## 2016-06-06 NOTE — Therapy (Signed)
[facility] MAIN [facility] SERVICES [address] [place], [place], [address] Phone: [phone]   Fax:  [phone]  Physical Therapy Treatment  Patient Details  Name: Madison Evans MRN: [id number] Date of Birth: 04-Apr-1961 Referring Provider: Kennedy Bucker  Encounter Date: 06/06/2016    Past Medical History:  Diagnosis Date  . Abnormal Pap smear of cervix 11/97   LEEP, CIN III  . Acid reflux   . MVP (mitral valve prolapse)   . Vertigo     Past Surgical History:  Procedure Laterality Date  . CERVICAL BIOPSY  W/ LOOP ELECTRODE EXCISION  11/97   CIN III  . COLPOSCOPY  11/97   CIN III  . HYSTEROSCOPY  02/2003   benign polyps    There were no vitals filed for this visit.      Subjective Assessment - 06/06/16 1748    Subjective Patient is having pain and stiffness in LUE when she moves it that started in June. She is having 0/10 pain.    Pertinent History Hx of a fall onto her tailbone after a cheerleading routine. Hx of vaginal procedures to remove hematoma after vaginal delivery in 1985 and to remove a polyp from a reproductive organ which pt can not recall.    Patient Stated Goals To be able to use her arm better and have the pain.    Pain Onset More than a month ago     manual therapy: PA glides grade 3 and 4 x 30 bouts T 1- T8 STM to upper trap bilaterally  Therapeutic exercise'  ROM:  prior manual therapy115 flex left shoulder / after manual therapy122 left shoulder flex Prior tx136 abd left shoulder/ following treatment 138 left shoulder abd cat/ camel stretch x 30 sec LUE stretch in door way 30 sec Cane flex x 20 standing Cane horizontal abd/add x 20 standing  Shoulder protraction x 20  Shoulder retraction x 20 Prone lower trap x 15 Standing horizontal abd/add x 20 Standing cane flex x 20  Patient needs occasional verbal cueing to improve posture and cueing to correctly perform exercises slowly, holding at end of range  to increase motor firing of desired muscle to encourage fatigue.                             PT Long Term Goals - 05/31/16 1745      PT LONG TERM GOAL #1   Title Patient will report a worst pain of 3/10 on VAS in  left shoulder           to improve tolerance with ADLs and reduced symptoms with activities   Time 8   Period Weeks   Status New     PT LONG TERM GOAL #2   Title Patient will be independent in home exercise program to improve strength/mobility for better functional independence with ADLs   Time 8   Period Weeks   Status New     PT LONG TERM GOAL #3   Title Patient will demonstrate adequate shoulder ROM and strength to be able to shave and dress independently with pain less than 3/10   Time 8   Period Weeks   Status New             Patient will benefit from skilled therapeutic intervention in order to improve the following deficits and impairments:     Visit Diagnosis: No diagnosis found.  Problem List Patient Active Problem List   Diagnosis Date Noted  . History of migraine headaches 10/01/2014  . History of abnormal cervical Pap smear 10/26/2013  . Urge incontinence of urine 10/26/2013    [name], Usiel Astarita S 06/06/2016, 5:50 PM  [facility] [address] [place], [place], [address] Phone: [phone]   Fax:  [phone]  Name: Madison Evans MRN: [id number] Date of Birth: [birth year]

## 2016-06-13 ENCOUNTER — Ambulatory Visit
Admission: RE | Admit: 2016-06-13 | Discharge: 2016-06-13 | Disposition: A | Discharge status: Home / Self Care | Payer: BLUE CROSS/BLUE SHIELD | Source: Ambulatory Visit | Attending: Internal Medicine | Admitting: Internal Medicine

## 2016-06-13 ENCOUNTER — Ambulatory Visit: Payer: BLUE CROSS/BLUE SHIELD | Admitting: Physical Therapy

## 2016-06-13 ENCOUNTER — Encounter: Payer: Self-pay | Admitting: Physical Therapy

## 2016-06-13 DIAGNOSIS — M25511 Pain in right shoulder: Secondary | ICD-10-CM

## 2016-06-13 DIAGNOSIS — G8929 Other chronic pain: Secondary | ICD-10-CM

## 2016-06-13 DIAGNOSIS — M25512 Pain in left shoulder: Secondary | ICD-10-CM | POA: Diagnosis not present

## 2016-06-13 DIAGNOSIS — Z1231 Encounter for screening mammogram for malignant neoplasm of breast: Secondary | ICD-10-CM | POA: Diagnosis present

## 2016-06-13 NOTE — Therapy (Signed)
[facility] [address] [place], [place], [address] Phone: [phone]   Fax:  [phone]  Physical Therapy Treatment  Patient Details  Name: Florene GlenJulie A Beckley MRN: [id number] Date of Birth: 11-Oct-1960 Referring Provider: Kennedy BuckerMENZ, MICHAEL  Encounter Date: 06/13/2016      PT End of Session - 06/13/16 1754    Visit Number 4   Number of Visits 8   Date for PT Re-Evaluation 07/25/16   Authorization Type blue cross blue shield   PT Start Time 0545   PT Stop Time 0625   PT Time Calculation (min) 40 min   Activity Tolerance Patient tolerated treatment well   Behavior During Therapy [facility] for tasks assessed/performed      Past Medical History:  Diagnosis Date  . Abnormal Pap smear of cervix 11/97   LEEP, CIN III  . Acid reflux   . MVP (mitral valve prolapse)   . Vertigo     Past Surgical History:  Procedure Laterality Date  . CERVICAL BIOPSY  W/ LOOP ELECTRODE EXCISION  11/97   CIN III  . COLPOSCOPY  11/97   CIN III  . HYSTEROSCOPY  02/2003   benign polyps    There were no vitals filed for this visit.      Subjective Assessment - 06/13/16 1753    Subjective Patient says that she had 8/10 pain today early morning and her left shoulder was aggravated after taking on and off her shirt for a DR. apt.    Pertinent History Hx of a fall onto her tailbone after a cheerleading routine. Hx of vaginal procedures to remove hematoma after vaginal delivery in 1985 and to remove a polyp from a reproductive organ which pt can not recall.    Patient Stated Goals To be able to use her arm better and have the pain.    Currently in Pain? Yes   Pain Score 8    Pain Location Shoulder   Pain Orientation Left   Pain Descriptors / Indicators Aching   Pain Onset More than a month ago       Supine R shoulder circles, CW 2 x 10, CCW 2 x 10; with shoulder ranger Supine canes for flexion 2 x 10; Finger ladder x 2 L sidelying R shoulder  ER, no weight 2 x 10; Sidelying shoulder flex with 2 lbs x 20 x 2 Seated RTB rows 2 x 10 AROM left shoulder flex 120, shoulder abd 90  Manual therapy :  PA glides grade 3 and 4 x 30 T 1- T8 Patient has complaints of BUE shoulder pain that gets better as the day continues. She has some pain during exercises and has burning and tingling in hands and elbows.                  PT Education - 06/13/16 1754    Education provided Yes   Education Details HEP   Person(s) Educated Patient   Methods Explanation   Comprehension Verbalized understanding             PT Long Term Goals - 05/31/16 1745      PT LONG TERM GOAL #1   Title Patient will report a worst pain of 3/10 on VAS in  left shoulder           to improve tolerance with ADLs and reduced symptoms with activities   Time 8   Period Weeks   Status New  PT LONG TERM GOAL #2   Title Patient will be independent in home exercise program to improve strength/mobility for better functional independence with ADLs   Time 8   Period Weeks   Status New     PT LONG TERM GOAL #3   Title Patient will demonstrate adequate shoulder ROM and strength to be able to shave and dress independently with pain less than 3/10   Time 8   Period Weeks   Status New               Plan - 06/13/16 1755    Clinical Impression Statement Patient performs all exercises and has decreased pain following exercises.    Rehab Potential Good   PT Frequency 2x / week   PT Duration 12 weeks   PT Treatment/Interventions Cryotherapy;Moist Heat;Functional mobility training;Therapeutic activities;Therapeutic exercise;Patient/family education;Manual techniques;Passive range of motion;Iontophoresis 4mg /ml Dexamethasone;Electrical Stimulation   PT Next Visit Plan manual therapy to left shoulder, exercises to left shoulder, posture education   PT Home Exercise Plan scapula retraction wiht YTB, chin tuck, pulley   Consulted and Agree with Plan of  Care Patient      Patient will benefit from skilled therapeutic intervention in order to improve the following deficits and impairments:  Decreased mobility, Decreased range of motion, Decreased strength, Hypermobility, Pain, Postural dysfunction, Impaired flexibility  Visit Diagnosis: Chronic left shoulder pain  Chronic right shoulder pain     Problem List Patient Active Problem List   Diagnosis Date Noted  . History of migraine headaches 10/01/2014  . History of abnormal cervical Pap smear 10/26/2013  . Urge incontinence of urine 10/26/2013    [name] 06/13/2016, 5:57 PM  [place] [facility] MAIN [facility] SERVICES [address] [place], [place], [address] Phone: [phone]   Fax:  [phone]  Name: ZELENA BUSHONG MRN: [id number] Date of Birth: 15-Nov-1960

## 2016-06-13 NOTE — Telephone Encounter (Signed)
Notified patient she needs provera challenge.  She has started medication.  She will send MyChart message with bleeding report.  She will also note at that time if her hair loss has improved or gotten worse.

## 2016-06-18 ENCOUNTER — Encounter: Payer: BLUE CROSS/BLUE SHIELD | Admitting: Physical Therapy

## 2016-06-20 ENCOUNTER — Ambulatory Visit: Payer: BLUE CROSS/BLUE SHIELD | Admitting: Physical Therapy

## 2016-06-20 DIAGNOSIS — M25511 Pain in right shoulder: Secondary | ICD-10-CM

## 2016-06-20 DIAGNOSIS — M25512 Pain in left shoulder: Secondary | ICD-10-CM | POA: Diagnosis not present

## 2016-06-20 DIAGNOSIS — G8929 Other chronic pain: Secondary | ICD-10-CM

## 2016-06-21 ENCOUNTER — Encounter: Payer: Self-pay | Admitting: Physical Therapy

## 2016-06-21 NOTE — Therapy (Signed)
[place] [facility] MAIN [facility] SERVICES [address] [place], [place], [address] Phone: [phone]   Fax:  [phone]  Physical Therapy Treatment  Patient Details  Name: Madison Evans MRN: [id number] Date of Birth: 1961-04-19 Referring Provider: Kennedy Bucker  Encounter Date: 06/20/2016      PT End of Session - 06/21/16 0851    Visit Number 5   Number of Visits 8   Date for PT Re-Evaluation 07/25/16   Authorization Type blue cross blue shield   PT Start Time 0545   PT Stop Time 0630   PT Time Calculation (min) 45 min   Activity Tolerance Patient tolerated treatment well   Behavior During Therapy [facility] for tasks assessed/performed      Past Medical History:  Diagnosis Date  . Abnormal Pap smear of cervix 11/97   LEEP, CIN III  . Acid reflux   . MVP (mitral valve prolapse)   . Vertigo     Past Surgical History:  Procedure Laterality Date  . CERVICAL BIOPSY  W/ LOOP ELECTRODE EXCISION  11/97   CIN III  . COLPOSCOPY  11/97   CIN III  . HYSTEROSCOPY  02/2003   benign polyps    There were no vitals filed for this visit.      Subjective Assessment - 06/21/16 0850    Subjective Patient says that her pain in radiating down to her wrist and arm.    Pertinent History Hx of a fall onto her tailbone after a cheerleading routine. Hx of vaginal procedures to remove hematoma after vaginal delivery in 1985 and to remove a polyp from a reproductive organ which pt can not recall.    Patient Stated Goals To be able to use her arm better and have the pain.    Pain Onset More than a month ago       Manual therapy to thoracic spine: PA glides grade 3 a 4 T1- T 12 x 30 bouts Manual traction intermittent to cervical spine x 15 minutes Patient has relief following therapy with decreased symptoms to LUE.                          PT Education - 06/21/16 0850    Education provided Yes   Education Details educated about manual  and intermittent cervical traction   Person(s) Educated Patient   Methods Explanation   Comprehension Verbalized understanding             PT Long Term Goals - 05/31/16 1745      PT LONG TERM GOAL #1   Title Patient will report a worst pain of 3/10 on VAS in  left shoulder           to improve tolerance with ADLs and reduced symptoms with activities   Time 8   Period Weeks   Status New     PT LONG TERM GOAL #2   Title Patient will be independent in home exercise program to improve strength/mobility for better functional independence with ADLs   Time 8   Period Weeks   Status New     PT LONG TERM GOAL #3   Title Patient will demonstrate adequate shoulder ROM and strength to be able to shave and dress independently with pain less than 3/10   Time 8   Period Weeks   Status New               Plan -  06/21/16 0851    Clinical Impression Statement Patient is not able to perform exercises today due to pain after 1 repetition. She was able to tolerate manual and cervical traction with pain relief to left UE following treatment.    Rehab Potential Good   PT Frequency 2x / week   PT Duration 12 weeks   PT Treatment/Interventions Cryotherapy;Moist Heat;Functional mobility training;Therapeutic activities;Therapeutic exercise;Patient/family education;Manual techniques;Passive range of motion;Iontophoresis 4mg /ml Dexamethasone;Electrical Stimulation   PT Next Visit Plan manual therapy to left shoulder, exercises to left shoulder, posture education   PT Home Exercise Plan scapula retraction wiht YTB, chin tuck, pulley   Consulted and Agree with Plan of Care Patient      Patient will benefit from skilled therapeutic intervention in order to improve the following deficits and impairments:  Decreased mobility, Decreased range of motion, Decreased strength, Hypermobility, Pain, Postural dysfunction, Impaired flexibility  Visit Diagnosis: Chronic left shoulder pain  Chronic right  shoulder pain     Problem List Patient Active Problem List   Diagnosis Date Noted  . History of migraine headaches 10/01/2014  . History of abnormal cervical Pap smear 10/26/2013  . Urge incontinence of urine 10/26/2013    [name], Dequincy Born S 06/21/2016, 8:53 AM  [facility] [address] [place], [place], [address] Phone: [phone]   Fax:  [phone]  Name: Madison Evans MRN: [id number] Date of Birth: 1961/06/01

## 2016-06-25 ENCOUNTER — Encounter: Payer: BLUE CROSS/BLUE SHIELD | Admitting: Physical Therapy

## 2016-06-27 ENCOUNTER — Ambulatory Visit: Payer: BLUE CROSS/BLUE SHIELD | Admitting: Physical Therapy

## 2016-06-27 ENCOUNTER — Encounter: Payer: Self-pay | Admitting: Physical Therapy

## 2016-06-27 DIAGNOSIS — M25512 Pain in left shoulder: Secondary | ICD-10-CM | POA: Diagnosis not present

## 2016-06-27 DIAGNOSIS — M25511 Pain in right shoulder: Secondary | ICD-10-CM

## 2016-06-27 DIAGNOSIS — G8929 Other chronic pain: Secondary | ICD-10-CM

## 2016-06-27 NOTE — Therapy (Signed)
[place] [facility] [address] [place], [place], [address] Phone: [phone]   Fax:  [phone]  Physical Therapy Treatment  Patient Details  Name: Florene GlenJulie A Pitcock MRN: [id number] Date of Birth: [birth year] Referring Provider: Kennedy BuckerMENZ, MICHAEL  Encounter Date: 06/27/2016      PT End of Session - 06/27/16 1802    Visit Number 6   Number of Visits 8   Date for PT Re-Evaluation 07/25/16   Authorization Type blue cross blue shield   PT Start Time 0545   PT Stop Time 0625   PT Time Calculation (min) 40 min   Activity Tolerance Patient tolerated treatment well   Behavior [facility] for tasks assessed/performed      Past Medical History:  Diagnosis Date  . Abnormal Pap smear of cervix 11/97   LEEP, CIN III  . Acid reflux   . MVP (mitral valve prolapse)   . Vertigo     Past Surgical History:  Procedure Laterality Date  . CERVICAL BIOPSY  W/ LOOP ELECTRODE EXCISION  11/97   CIN III  . COLPOSCOPY  11/97   CIN III  . HYSTEROSCOPY  02/2003   benign polyps    There were no vitals filed for this visit.      Subjective Assessment - 06/27/16 1800    Subjective Patient says that her pain in radiating down to her wrist and arm. She has pain if she reaches behind her back and if she raises her arm out to the side.    Pertinent History Hx of a fall onto her tailbone after a cheerleading routine. Hx of vaginal procedures to remove hematoma after vaginal delivery in 1985 and to remove a polyp from a reproductive organ which pt can not recall.    Patient Stated Goals To be able to use her arm better and have the pain.    Currently in Pain? Yes   Pain Score 9    Pain Type Chronic pain   Pain Onset More than a month ago     Cervical traction  Intermittent pulling 25 lbs hold for 15 sec and pull for 15 sec  HEP was reviewed . Patient has reports of no cervical pain and shoulder pain following  therapy.                           PT Education - 06/27/16 1802    Education provided Yes   Education Details educated about cervical traction   Person(s) Educated Patient   Methods Explanation   Comprehension Verbalized understanding             PT Long Term Goals - 05/31/16 1745      PT LONG TERM GOAL #1   Title Patient will report a worst pain of 3/10 on VAS in  left shoulder           to improve tolerance with ADLs and reduced symptoms with activities   Time 8   Period Weeks   Status New     PT LONG TERM GOAL #2   Title Patient will be independent in home exercise program to improve strength/mobility for better functional independence with ADLs   Time 8   Period Weeks   Status New     PT LONG TERM GOAL #3   Title Patient will demonstrate adequate shoulder ROM and strength to be able to shave and dress independently with pain less  than 3/10   Time 8   Period Weeks   Status New               Plan - 06/27/16 1803    Clinical Impression Statement Patient continues to have intermittent pain that is 0/10 wihtout movement and increases to 9/10 with movement. He pain is improving and mobility is improving and she is able to assit her grand daughter without having increased pain .    Rehab Potential Good   PT Frequency 2x / week   PT Duration 12 weeks   PT Treatment/Interventions Cryotherapy;Moist Heat;Functional mobility training;Therapeutic activities;Therapeutic exercise;Patient/family education;Manual techniques;Passive range of motion;Iontophoresis 4mg /ml Dexamethasone;Electrical Stimulation   PT Next Visit Plan manual therapy to left shoulder, exercises to left shoulder, posture education   PT Home Exercise Plan scapula retraction wiht YTB, chin tuck, pulley   Consulted and Agree with Plan of Care Patient      Patient will benefit from skilled therapeutic intervention in order to improve the following deficits and impairments:   Decreased mobility, Decreased range of motion, Decreased strength, Hypermobility, Pain, Postural dysfunction, Impaired flexibility  Visit Diagnosis: Chronic left shoulder pain  Chronic right shoulder pain     Problem List Patient Active Problem List   Diagnosis Date Noted  . History of migraine headaches 10/01/2014  . History of abnormal cervical Pap smear 10/26/2013  . Urge incontinence of urine 10/26/2013   [name], PT, DPT [place], [place] S 06/27/2016, 6:11 PM  [facility] MAIN [facility] SERVICES [address] [place], [place], [address] Phone: [phone]   Fax:  [phone]  Name: GERALDYN SHAIN MRN: [id number] Date of Birth: 08/29/1961

## 2016-07-05 ENCOUNTER — Encounter: Payer: Self-pay | Admitting: Nurse Practitioner

## 2016-07-09 ENCOUNTER — Encounter: Payer: BLUE CROSS/BLUE SHIELD | Admitting: Physical Therapy

## 2016-07-11 ENCOUNTER — Ambulatory Visit: Payer: BLUE CROSS/BLUE SHIELD | Attending: Urology | Admitting: Physical Therapy

## 2016-07-11 DIAGNOSIS — M25512 Pain in left shoulder: Secondary | ICD-10-CM | POA: Insufficient documentation

## 2016-07-11 DIAGNOSIS — M25511 Pain in right shoulder: Secondary | ICD-10-CM | POA: Insufficient documentation

## 2016-07-11 DIAGNOSIS — G8929 Other chronic pain: Secondary | ICD-10-CM | POA: Diagnosis present

## 2016-07-12 ENCOUNTER — Encounter: Payer: Self-pay | Admitting: Physical Therapy

## 2016-07-12 NOTE — Therapy (Signed)
[place] [facility] [address] [place], [place], [address] Phone: [phone]   Fax:  [phone]  Physical Therapy Treatment  Patient Details  Name: Madison Evans MRN: [id number] Date of Birth: 04-18-1961 Referring Provider: Kennedy BuckerMENZ, MICHAEL  Encounter Date: 07/11/2016      PT End of Session - 07/12/16 0939    Visit Number 7   Number of Visits 8   Date for PT Re-Evaluation 07/25/16   Authorization Type blue cross blue shield   Activity Tolerance Patient tolerated treatment well   Behavior During Therapy [facility] for tasks assessed/performed      Past Medical History:  Diagnosis Date  . Abnormal Pap smear of cervix 11/97   LEEP, CIN III  . Acid reflux   . MVP (mitral valve prolapse)   . Vertigo     Past Surgical History:  Procedure Laterality Date  . CERVICAL BIOPSY  W/ LOOP ELECTRODE EXCISION  11/97   CIN III  . COLPOSCOPY  11/97   CIN III  . HYSTEROSCOPY  02/2003   benign polyps    There were no vitals filed for this visit.      Subjective Assessment - 07/12/16 0937    Subjective Patient reports that she has no pain except after she raises her left arm past 90 deg, or if she needs to do a quick movement unexpected with her arm.    Pertinent History Hx of a fall onto her tailbone after a cheerleading routine. Hx of vaginal procedures to remove hematoma after vaginal delivery in 1985 and to remove a polyp from a reproductive organ which pt can not recall.    Patient Stated Goals To be able to use her arm better and have the pain.    Pain Onset More than a month ago      Manual therapy: PA glide grade 4 to T1-T12 and STM to scapula and trap musculature bilaterally Patient has AROM 0-90 deg left shoulder abd prior to manual therapy and 0-150 following therapy.  Patient has no reports of pain following therapy.                            PT Education - 07/12/16 [phone]    Education provided Yes    Education Details HEP   Person(Evans) Educated Patient   Methods Explanation   Comprehension Verbalized understanding             PT Long Term Goals - 05/31/16 1745      PT LONG TERM GOAL #1   Title Patient will report a worst pain of 3/10 on VAS in  left shoulder           to improve tolerance with ADLs and reduced symptoms with activities   Time 8   Period Weeks   Status New     PT LONG TERM GOAL #2   Title Patient will be independent in home exercise program to improve strength/mobility for better functional independence with ADLs   Time 8   Period Weeks   Status New     PT LONG TERM GOAL #3   Title Patient will demonstrate adequate shoulder ROM and strength to be able to shave and dress independently with pain less than 3/10   Time 8   Period Weeks   Status New               Plan - 07/12/16 [id number]  Clinical Impression Statement Patient is seen for manual therapy to thoracic spine T1- T12 , PA grade 4 x 30 bouts and has increased AROM to left shoudler to 150 deg without pain and is able to perform shoulder protraction exercises without pain.    Rehab Potential Good   PT Frequency 2x / week   PT Duration 12 weeks   PT Treatment/Interventions Cryotherapy;Moist Heat;Functional mobility training;Therapeutic activities;Therapeutic exercise;Patient/family education;Manual techniques;Passive range of motion;Iontophoresis 4mg /ml Dexamethasone;Electrical Stimulation   PT Next Visit Plan manual therapy to left shoulder, exercises to left shoulder, posture education   PT Home Exercise Plan scapula retraction wiht YTB, chin tuck, pulley   Consulted and Agree with Plan of Care Patient      Patient will benefit from skilled therapeutic intervention in order to improve the following deficits and impairments:  Decreased mobility, Decreased range of motion, Decreased strength, Hypermobility, Pain, Postural dysfunction, Impaired flexibility  Visit Diagnosis: Chronic left  shoulder pain  Chronic right shoulder pain     Problem List Patient Active Problem List   Diagnosis Date Noted  . History of migraine headaches 10/01/2014  . History of abnormal cervical Pap smear 10/26/2013  . Urge incontinence of urine 10/26/2013    Madison Evans 07/12/2016, 9:41 AM  [place] [facility] [address] [place], [place], [address] Phone: [phone]   Fax:  [phone]  Name: Madison Evans MRN: [id number] Date of Birth: September 10, 1960

## 2016-08-15 ENCOUNTER — Ambulatory Visit: Payer: BLUE CROSS/BLUE SHIELD | Attending: Urology | Admitting: Physical Therapy

## 2016-08-15 DIAGNOSIS — M25511 Pain in right shoulder: Secondary | ICD-10-CM | POA: Diagnosis present

## 2016-08-15 DIAGNOSIS — G8929 Other chronic pain: Secondary | ICD-10-CM | POA: Insufficient documentation

## 2016-08-15 DIAGNOSIS — M25512 Pain in left shoulder: Secondary | ICD-10-CM | POA: Diagnosis present

## 2016-08-16 ENCOUNTER — Encounter: Payer: Self-pay | Admitting: Physical Therapy

## 2016-08-16 NOTE — Therapy (Signed)
[place] MAIN [facility] SERVICES [address] [place], [place], [address] Phone: [phone]   Fax:  [phone]  Physical Therapy Treatment  Patient Details  Name: Madison Evans MRN: [id number] Date of Birth: 23-Apr-1961 Referring Provider: Hessie Knows  Encounter Date: 08/15/2016      PT End of Session - 08/16/16 0959    Visit Number 8   Number of Visits 8   Date for PT Re-Evaluation 07/25/16   Authorization Type blue cross blue shield   PT Start Time 0545   PT Stop Time 0630   PT Time Calculation (min) 45 min   Activity Tolerance Patient limited by pain   Behavior During Therapy [facility] for tasks assessed/performed      Past Medical History:  Diagnosis Date  . Abnormal Pap smear of cervix 11/97   LEEP, CIN III  . Acid reflux   . MVP (mitral valve prolapse)   . Vertigo     Past Surgical History:  Procedure Laterality Date  . CERVICAL BIOPSY  W/ LOOP ELECTRODE EXCISION  11/97   CIN III  . COLPOSCOPY  11/97   CIN III  . HYSTEROSCOPY  02/2003   benign polyps    There were no vitals filed for this visit.      Subjective Assessment - 08/16/16 0957    Subjective Patietn is having pain in her left shoulder with movement above 70 deg abd and flex.   Currently in Pain? Yes   Pain Score 6    Pain Location Shoulder   Pain Orientation Left   Pain Descriptors / Indicators Aching   Pain Type Chronic pain   Pain Onset More than a month ago   Pain Frequency Constant   Aggravating Factors  movement   Pain Relieving Factors ice   Effect of Pain on Daily Activities unable to perform ADL's   Multiple Pain Sites No        Manual therapy: PA glide grade 4 to T1-T12, and STM to scapula and trap musculature bilaterally Patient has AROM 0-82 deg left shoulder abd prior to manual therapy and 0-120  following therapy  Patient has AROM 0-70deg left shoulder flex prior to manual therapy and 0-90 following therapy Patient continues to  have increased pain following therapy 6/10. Patients husband was educated and instructed in pain relieving techniques to assist his wife. Instructed in Home cervical traction unit for home program.                         PT Education - 08/16/16 0959    Education provided Yes   Education Details HEP   Person(s) Educated Patient   Methods Explanation   Comprehension Verbalized understanding             PT Long Term Goals - 08/16/16 1002      PT LONG TERM GOAL #1   Title Patient will report a worst pain of 3/10 on VAS in  left shoulder           to improve tolerance with ADLs and reduced symptoms with activities   Time 8   Period Weeks   Status Not Met     PT LONG TERM GOAL #2   Title Patient will be independent in home exercise program to improve strength/mobility for better functional independence with ADLs   Time 8   Status On-going     PT LONG TERM GOAL #3   Title Patient  will demonstrate adequate shoulder ROM and strength to be able to shave and dress independently with pain less than 3/10   Time 8   Period Weeks   Status Not Met     PT LONG TERM GOAL  #10   TITLE Pt will report decreased shoulder pain by > 50% and has been able to lay on her side when sleeping.    Time 8   Period Weeks   Status On-going               Plan - 08/16/16 0959    Clinical Impression Statement Patient responds to manual therapy to thoracic spine T1-T12 , PA glide grade 4 x 30 bouts and improved AROM following manual therapy to thoracici spine. Patient will continue to benefit from skilled PT for manual therapy and exercises to decrease her pain.    Rehab Potential Good   PT Frequency 2x / week   PT Duration 12 weeks   PT Treatment/Interventions Cryotherapy;Moist Heat;Functional mobility training;Therapeutic activities;Therapeutic exercise;Patient/family education;Manual techniques;Passive range of motion;Iontophoresis 62m/ml Dexamethasone;Electrical  Stimulation   PT Next Visit Plan manual therapy to left shoulder, exercises to left shoulder, posture education   PT Home Exercise Plan scapula retraction wiht YTB, chin tuck, pulley   Consulted and Agree with Plan of Care Patient      Patient will benefit from skilled therapeutic intervention in order to improve the following deficits and impairments:  Decreased mobility, Decreased range of motion, Decreased strength, Hypermobility, Pain, Postural dysfunction, Impaired flexibility  Visit Diagnosis: Chronic left shoulder pain  Chronic right shoulder pain     Problem List Patient Active Problem List   Diagnosis Date Noted  . History of migraine headaches 10/01/2014  . History of abnormal cervical Pap smear 10/26/2013  . Urge incontinence of urine 10/26/2013   KAlanson Puls PT, DPT [place] 08/16/2016, 10:03 AM  [facility] [address] [address]Phone: [phone]  Fax:  [phone] Name: Madison PAVLOVICMRN: [id number] of Birth: 230-Aug-1962

## 2016-09-04 ENCOUNTER — Other Ambulatory Visit: Payer: Self-pay | Admitting: Orthopedic Surgery

## 2016-09-04 DIAGNOSIS — M75122 Complete rotator cuff tear or rupture of left shoulder, not specified as traumatic: Secondary | ICD-10-CM

## 2016-09-17 ENCOUNTER — Ambulatory Visit
Admission: RE | Admit: 2016-09-17 | Discharge: 2016-09-17 | Disposition: A | Discharge status: Home / Self Care | Payer: BLUE CROSS/BLUE SHIELD | Source: Ambulatory Visit | Attending: Orthopedic Surgery | Admitting: Orthopedic Surgery

## 2016-09-17 DIAGNOSIS — M75122 Complete rotator cuff tear or rupture of left shoulder, not specified as traumatic: Secondary | ICD-10-CM

## 2016-10-05 DIAGNOSIS — M7542 Impingement syndrome of left shoulder: Secondary | ICD-10-CM | POA: Diagnosis not present

## 2016-10-17 DIAGNOSIS — H6981 Other specified disorders of Eustachian tube, right ear: Secondary | ICD-10-CM | POA: Diagnosis not present

## 2016-10-17 DIAGNOSIS — J069 Acute upper respiratory infection, unspecified: Secondary | ICD-10-CM | POA: Diagnosis not present

## 2016-10-20 ENCOUNTER — Encounter: Payer: Self-pay | Admitting: Nurse Practitioner

## 2016-10-23 ENCOUNTER — Telehealth: Payer: Self-pay | Admitting: *Deleted

## 2016-10-23 NOTE — Telephone Encounter (Signed)
Yes she needs Provera challenge again - repeat this now and will follow with results at [facility]EX.

## 2016-10-23 NOTE — Telephone Encounter (Signed)
See telephone encounter dated 10/23/16 to review with provider.

## 2016-10-23 NOTE — Telephone Encounter (Signed)
Ria CommentPatricia Grubb, NP -please review MyChart message below and advise?  From Florene GlenJulie A Burggraf To Ria CommentPatricia Grubb, FNP Sent 10/20/2016 9:50 PM  Hi!  You prescribed 10 Provera pills for me on October 3rd since I hadn't had a period for over 3 months. I took the 10th pill on October 13th and had a very light period from November 13th - 16th. I haven't had a period since and you wanted me to let you know if I haven't had one in 3 months. I'm scheduled to come in for my annual checkup on Friday, March 2nd. I just thought I'd give you a heads up in case you wanted me to start another round of Provera before my visit. Thanks.

## 2016-10-24 MED ORDER — MEDROXYPROGESTERONE ACETATE 10 MG PO TABS: 10.0000 mg | ORAL_TABLET | Freq: Every day | ORAL | 0 refills | Status: DC

## 2016-10-24 NOTE — Telephone Encounter (Signed)
Reviewed with patient, see telephone encounter dated 10/23/16.

## 2016-10-24 NOTE — Telephone Encounter (Signed)
Spoke with patient, advised as seen below per Ria CommentPatricia Grubb, NP. Patient would like to change [facility] appointment so that she may discuss results at [facility]. AEX rescheduled to 11/14/16 at 0900. Provera sent to verified pharmacy on file. Patient is agreeable and verbalizes understanding.  Routing to provider for final review. Patient is agreeable to disposition. Will close encounter.

## 2016-10-25 ENCOUNTER — Other Ambulatory Visit: Payer: Self-pay | Admitting: Internal Medicine

## 2016-10-25 DIAGNOSIS — H6983 Other specified disorders of Eustachian tube, bilateral: Secondary | ICD-10-CM | POA: Diagnosis not present

## 2016-10-25 DIAGNOSIS — R1012 Left upper quadrant pain: Secondary | ICD-10-CM | POA: Diagnosis not present

## 2016-10-25 DIAGNOSIS — Z0000 Encounter for general adult medical examination without abnormal findings: Secondary | ICD-10-CM | POA: Diagnosis not present

## 2016-10-25 DIAGNOSIS — M5030 Other cervical disc degeneration, unspecified cervical region: Secondary | ICD-10-CM | POA: Diagnosis not present

## 2016-11-01 ENCOUNTER — Ambulatory Visit
Admission: RE | Admit: 2016-11-01 | Discharge: 2016-11-01 | Disposition: A | Discharge status: Home / Self Care | Payer: BLUE CROSS/BLUE SHIELD | Source: Ambulatory Visit | Attending: Internal Medicine | Admitting: Internal Medicine

## 2016-11-01 DIAGNOSIS — R1012 Left upper quadrant pain: Secondary | ICD-10-CM

## 2016-11-01 DIAGNOSIS — K7689 Other specified diseases of liver: Secondary | ICD-10-CM | POA: Insufficient documentation

## 2016-11-02 ENCOUNTER — Ambulatory Visit: Payer: BLUE CROSS/BLUE SHIELD

## 2016-11-02 ENCOUNTER — Ambulatory Visit: Payer: BLUE CROSS/BLUE SHIELD | Admitting: Nurse Practitioner

## 2016-11-09 ENCOUNTER — Encounter: Payer: Self-pay | Admitting: Nurse Practitioner

## 2016-11-13 ENCOUNTER — Telehealth: Payer: Self-pay

## 2016-11-13 ENCOUNTER — Encounter: Payer: Self-pay | Admitting: Nurse Practitioner

## 2016-11-13 NOTE — Telephone Encounter (Signed)
Non-Urgent Medical Question  Message [id number]  From Florene GlenJulie A Kite To Ria CommentPatricia Grubb, FNP Sent 11/09/2016 12:51 PM  Hi!  You prescribed 10 Provera pills for me on 10/24/16 since I hadn't had a period for over 3 months. I took the 10th pill on 11/02/16 and haven't had a period yet. I'm scheduled to come in for my annual checkup on Wednesday, March 14th. I just thought I'd give you a heads up in case there's any testing you want done during my visit to see if I'm in menopause. Thanks.   Responsible Party   Pool - [facility] Clinical Pool No one has taken responsibility for this message.  No actions have been taken on this message.   Routing to Ria CommentPatricia Grubb, FNP as Lorain ChildesFYI. Okay to close encounter?

## 2016-11-13 NOTE — Progress Notes (Signed)
Patient ID: Madison Evans, female   DOB: 02/15/1961, 56 y.o.   MRN: [id number]  56 y.o. G2P2002 Married  Caucasian Fe here for annual exam. Took Provera challenge on May 14th with cycle on May 22- 28th that was pretty heavy.  She had another cycle on 02/29/16.  Then on October 3rd given another Provera challenge without bleeding. Then light cycle 12/4 17 that light X 2 days.  Provera challenge again on 10/24/16 and since then amenorrhea.   [facility]   [facility]  She saw Dr. Perley JainMcDiarmid last March for mixed incontinence. She then went to PT for low back pain and pelvic floor exercises.  Patient's last menstrual period was 08/06/2016.          Sexually active: Yes.    The current method of family planning is vasectomy.    Exercising: Yes.    boot camp 3x week, walking, cardio Smoker:  no  Health Maintenance: Pap: 10/27/14, Negative with neg HR HPV (history of CIN-III)  10/26/13, Negative  10/23/12, Negative with neg HR HPV MMG: 06/13/16, 3D, Bi-Rads 1:  Negative Colonoscopy: 02/2012 recheck in 10 yrs BMD: never TDaP: 10/26/13 Hep C and HIV: 02/10/16 Labs: blood work done next week with husbands job   reports that she has never smoked. She has never used smokeless tobacco. She reports that she drinks about 0.6 oz of alcohol per week . She reports that she does not use drugs.  Past Medical History:  Diagnosis Date  . Abnormal Pap smear of cervix 11/97   LEEP, CIN III  . Acid reflux   . MVP (mitral valve prolapse)   . Vertigo     Past Surgical History:  Procedure Laterality Date  . CERVICAL BIOPSY  W/ LOOP ELECTRODE EXCISION  11/97   CIN III  . COLPOSCOPY  11/97   CIN III  . HYSTEROSCOPY  02/2003   benign polyps    Current Outpatient Prescriptions  Medication Sig Dispense Refill  . ACZONE 5 % topical gel Apply topically 2 (two) times daily.    Marland Kitchen. aspirin EC 81 MG tablet Take by mouth. Reported on 11/11/2015    . doxycycline (ORACEA) 40 MG capsule Take  40 mg by mouth daily.    Marland Kitchen. glucosamine-chondroitin 500-400 MG tablet Take 1 tablet by mouth daily.    . Ibuprofen (ADVIL) 200 MG CAPS Take 1 capsule by mouth.    . medroxyPROGESTERone (PROVERA) 10 MG tablet Take 1 tablet (10 mg total) by mouth daily. 10 tablet 0  . mirabegron ER (MYRBETRIQ) 25 MG TB24 tablet Take 1 tablet (25 mg total) by mouth daily. 30 tablet 11  . Multiple Vitamin (MULTI-VITAMINS) TABS Take by mouth.    Marland Kitchen. OVER THE COUNTER MEDICATION Take 1 capsule by mouth daily. Reported on 11/11/2015     No current facility-administered medications for this visit.     Family History  Problem Relation Age of Onset  . Osteoporosis Mother   . Diabetes Sister   . Osteoporosis Maternal Grandmother     ROS:  Pertinent items are noted in HPI.  Otherwise, a comprehensive ROS was negative.  Exam:   BP 98/60 (BP Location: Right Arm, Patient Position: Sitting, Cuff Size: Normal)   Pulse 68   Resp 16   Ht 5' 2.5" (1.588 m)   Wt 145 lb 9.6 oz (66 kg)   LMP 08/06/2016   BMI 26.21 kg/m  Height: 5' 2.5" (158.8 cm) Ht Readings from  Last 3 Encounters:  11/14/16 5' 2.5" (1.588 m)  10/31/15 5' 2.75" (1.594 m)  01/27/15 5' 2.75" (1.594 m)    General appearance: alert, cooperative and appears stated age Head: Normocephalic, without obvious abnormality, atraumatic Neck: no adenopathy, supple, symmetrical, trachea midline and thyroid normal to inspection and palpation Lungs: clear to auscultation bilaterally Breasts: normal appearance, no masses or tenderness Heart: regular rate and rhythm Abdomen: soft, non-tender; no masses,  no organomegaly Extremities: extremities normal, atraumatic, no cyanosis or edema Skin: Skin color, texture, turgor normal. No rashes or lesions Lymph nodes: Cervical, supraclavicular, and axillary nodes normal. No abnormal inguinal nodes palpated Neurologic: Grossly normal   Pelvic: External genitalia:  no lesions              Urethra:  normal appearing urethra  with no masses, tenderness or lesions              Bartholin's and Skene's: normal                 Vagina: normal appearing vagina with normal color and discharge, no lesions              Cervix: anteverted              Pap taken: No. Bimanual Exam:  Uterus:  normal size, contour, position, consistency, mobility, non-tender              Adnexa: no mass, fullness, tenderness               Rectovaginal: Confirms               Anus:  normal sphincter tone, no lesions  Chaperone present: yes  A:  Well Woman with normal exam  History of irregular menses and on OCP for cycle regulation until 10/2014 History of CIN III with LEEP 11/97 normal since Urge/ stress incontinence             Current amenorrhea since 12/17   Last Provera challenge 2/17 without withdrawal  Vaso symptoms that are tolerable   P:   Reviewed health and wellness pertinent to exam  Pap smear not done  Mammogram is due 06/2017  IFOB is given, FSH is drawn  Counseled on breast self exam, mammography screening, menopause, adequate intake of calcium and vitamin D, diet and exercise, Kegel's exercises return annually or prn  An After Visit Summary was printed and given to the patient.

## 2016-11-14 ENCOUNTER — Encounter: Payer: Self-pay | Admitting: Nurse Practitioner

## 2016-11-14 ENCOUNTER — Ambulatory Visit (INDEPENDENT_AMBULATORY_CARE_PROVIDER_SITE_OTHER): Payer: BLUE CROSS/BLUE SHIELD | Admitting: Nurse Practitioner

## 2016-11-14 VITALS — BP 98/60 | HR 68 | Resp 16 | Ht 62.5 in | Wt 145.6 lb

## 2016-11-14 DIAGNOSIS — Z1211 Encounter for screening for malignant neoplasm of colon: Secondary | ICD-10-CM

## 2016-11-14 DIAGNOSIS — Z0000 Encounter for general adult medical examination without abnormal findings: Secondary | ICD-10-CM

## 2016-11-14 DIAGNOSIS — N3946 Mixed incontinence: Secondary | ICD-10-CM

## 2016-11-14 DIAGNOSIS — N951 Menopausal and female climacteric states: Secondary | ICD-10-CM | POA: Diagnosis not present

## 2016-11-14 DIAGNOSIS — N912 Amenorrhea, unspecified: Secondary | ICD-10-CM

## 2016-11-14 DIAGNOSIS — Z01411 Encounter for gynecological examination (general) (routine) with abnormal findings: Secondary | ICD-10-CM | POA: Diagnosis not present

## 2016-11-14 NOTE — Patient Instructions (Addendum)

## 2016-11-15 LAB — FOLLICLE STIMULATING HORMONE: FSH: 112.8 m[IU]/mL

## 2016-11-15 NOTE — Progress Notes (Signed)
Encounter reviewed by Dr. Carlynn Leduc Amundson C. Silva.  

## 2017-01-02 DIAGNOSIS — N62 Hypertrophy of breast: Secondary | ICD-10-CM | POA: Diagnosis not present

## 2017-01-03 LAB — FECAL OCCULT BLOOD, IMMUNOCHEMICAL: IFOBT: NEGATIVE

## 2017-01-03 NOTE — Addendum Note (Signed)
Addended by: Zenovia JordanMITCHELL, Deondrae Mcgrail A on: 01/03/2017 08:15 AM   Modules accepted: Orders

## 2017-01-24 DIAGNOSIS — H6983 Other specified disorders of Eustachian tube, bilateral: Secondary | ICD-10-CM | POA: Diagnosis not present

## 2017-05-08 DIAGNOSIS — D485 Neoplasm of uncertain behavior of skin: Secondary | ICD-10-CM | POA: Diagnosis not present

## 2017-05-08 DIAGNOSIS — Z1283 Encounter for screening for malignant neoplasm of skin: Secondary | ICD-10-CM | POA: Diagnosis not present

## 2017-05-08 DIAGNOSIS — L820 Inflamed seborrheic keratosis: Secondary | ICD-10-CM | POA: Diagnosis not present

## 2017-05-08 DIAGNOSIS — L821 Other seborrheic keratosis: Secondary | ICD-10-CM | POA: Diagnosis not present

## 2017-05-08 DIAGNOSIS — D229 Melanocytic nevi, unspecified: Secondary | ICD-10-CM | POA: Diagnosis not present

## 2017-06-17 ENCOUNTER — Other Ambulatory Visit: Payer: Self-pay | Admitting: Internal Medicine

## 2017-06-17 DIAGNOSIS — Z1231 Encounter for screening mammogram for malignant neoplasm of breast: Secondary | ICD-10-CM

## 2017-07-04 ENCOUNTER — Ambulatory Visit
Admission: RE | Admit: 2017-07-04 | Discharge: 2017-07-04 | Disposition: A | Discharge status: Home / Self Care | Payer: BLUE CROSS/BLUE SHIELD | Source: Ambulatory Visit | Attending: Internal Medicine | Admitting: Internal Medicine

## 2017-07-04 DIAGNOSIS — Z1231 Encounter for screening mammogram for malignant neoplasm of breast: Secondary | ICD-10-CM | POA: Insufficient documentation

## 2017-09-13 ENCOUNTER — Telehealth: Payer: Self-pay | Admitting: Obstetrics and Gynecology

## 2017-09-13 NOTE — Telephone Encounter (Signed)
Patient would like to know if there's a surgeon that our office would recommend for breast reduction surgery.

## 2017-09-13 NOTE — Telephone Encounter (Signed)
Spoke with patient. Advised our office recommends Dr.David Odis LusterBowers for breast reductions. Provided information to his practice for her to call and schedule a consultation. Patient will call to schedule.  Routing to provider for final review. Patient agreeable to disposition. Will close encounter.

## 2017-10-28 DIAGNOSIS — E7800 Pure hypercholesterolemia, unspecified: Secondary | ICD-10-CM | POA: Diagnosis not present

## 2017-10-28 DIAGNOSIS — G4730 Sleep apnea, unspecified: Secondary | ICD-10-CM | POA: Insufficient documentation

## 2017-10-28 DIAGNOSIS — Z0000 Encounter for general adult medical examination without abnormal findings: Secondary | ICD-10-CM | POA: Diagnosis not present

## 2017-10-28 DIAGNOSIS — G4733 Obstructive sleep apnea (adult) (pediatric): Secondary | ICD-10-CM | POA: Diagnosis not present

## 2017-10-28 DIAGNOSIS — Z8669 Personal history of other diseases of the nervous system and sense organs: Secondary | ICD-10-CM | POA: Diagnosis not present

## 2017-10-29 ENCOUNTER — Other Ambulatory Visit: Payer: Self-pay | Admitting: Internal Medicine

## 2017-10-29 DIAGNOSIS — R1310 Dysphagia, unspecified: Secondary | ICD-10-CM

## 2017-11-19 ENCOUNTER — Ambulatory Visit: Payer: BLUE CROSS/BLUE SHIELD | Admitting: Nurse Practitioner

## 2017-11-19 ENCOUNTER — Ambulatory Visit: Payer: BLUE CROSS/BLUE SHIELD | Admitting: Obstetrics and Gynecology

## 2017-11-21 ENCOUNTER — Ambulatory Visit: Payer: BLUE CROSS/BLUE SHIELD

## 2017-11-28 ENCOUNTER — Encounter: Payer: Self-pay | Admitting: Obstetrics and Gynecology

## 2017-11-28 ENCOUNTER — Other Ambulatory Visit (HOSPITAL_COMMUNITY)
Admission: RE | Admit: 2017-11-28 | Discharge: 2017-11-28 | Disposition: A | Discharge status: Home / Self Care | Payer: BLUE CROSS/BLUE SHIELD | Source: Ambulatory Visit | Attending: Obstetrics and Gynecology | Admitting: Obstetrics and Gynecology

## 2017-11-28 ENCOUNTER — Ambulatory Visit (INDEPENDENT_AMBULATORY_CARE_PROVIDER_SITE_OTHER): Payer: BLUE CROSS/BLUE SHIELD | Admitting: Obstetrics and Gynecology

## 2017-11-28 ENCOUNTER — Other Ambulatory Visit: Payer: Self-pay

## 2017-11-28 VITALS — BP 110/64 | HR 56 | Resp 14 | Ht 62.5 in | Wt 142.8 lb

## 2017-11-28 DIAGNOSIS — N898 Other specified noninflammatory disorders of vagina: Secondary | ICD-10-CM

## 2017-11-28 DIAGNOSIS — Z01419 Encounter for gynecological examination (general) (routine) without abnormal findings: Secondary | ICD-10-CM | POA: Diagnosis not present

## 2017-11-28 DIAGNOSIS — Z124 Encounter for screening for malignant neoplasm of cervix: Secondary | ICD-10-CM | POA: Diagnosis not present

## 2017-11-28 NOTE — Progress Notes (Signed)
57 y.o. [id number] MarriedCaucasianF here for annual exam.  PMP, no bleeding. No dyspareunia.  She has neck problems and upper back/shoulder pain from her large breasts. Plans to get a breast reduction.     No LMP recorded. Patient is premenopausal.          Sexually active: Yes.    The current method of family planning is vasectomy.    Exercising: Yes.    bootcamp and cardio, 4 days a week Smoker:  no  Health Maintenance: Pap: 10-27-14 Neg:Neg HR HPV, 10-26-13 Neg History of abnormal Pap:  Yes, Hx LEEP 1997 CIN III MMG:  07-04-17 [id number] Colonoscopy: 02/2012 recheck in 10 yrs BMD:   n/a TDaP:  10-26-13 Gardasil: no   reports that she has never smoked. She has never used smokeless tobacco. She reports that she drinks about 0.6 oz of alcohol per week. She reports that she does not use drugs. She is a Psychiatrist, stressful job. Kids are 59 and 28, out of state. 2 grandchildren and another one is going to be adopted and another one due.   Past Medical History:  Diagnosis Date  . Abnormal Pap smear of cervix 11/97   LEEP, CIN III  . Acid reflux   . MVP (mitral valve prolapse)   . Vertigo     Past Surgical History:  Procedure Laterality Date  . CERVICAL BIOPSY  W/ LOOP ELECTRODE EXCISION  11/97   CIN III  . COLPOSCOPY  11/97   CIN III  . HYSTEROSCOPY  02/2003   benign polyps    Current Outpatient Medications  Medication Sig Dispense Refill  . ACZONE 5 % topical gel Apply topically 2 (two) times daily.    . Calcium Carbonate-Vit D-Min (CALCIUM 600+D3 PLUS MINERALS) 600-800 MG-UNIT CHEW Chew 1 tablet by mouth daily.    Marland Kitchen glucosamine-chondroitin 500-400 MG tablet Take 1 tablet by mouth daily.    . Ibuprofen (ADVIL) 200 MG CAPS Take 1 capsule by mouth.    . Multiple Vitamin (MULTI-VITAMINS) TABS Take by mouth.    . Probiotic Product (PROBIOTIC-10 ULTIMATE) CAPS Take 1 tablet by mouth daily.     No current facility-administered medications for this visit.     Family  History  Problem Relation Age of Onset  . Osteoporosis Mother   . Diabetes Sister   . Osteoporosis Maternal Grandmother     Review of Systems  Constitutional: Negative.   HENT: Negative.   Eyes: Negative.        Eye twitch  Respiratory: Negative.   Cardiovascular: Negative.   Gastrointestinal: Negative.   Endocrine: Negative.   Genitourinary: Negative.        Urinary incontinence  Musculoskeletal: Negative.   Skin: Negative.   Allergic/Immunologic: Negative.   Neurological: Negative.   Psychiatric/Behavioral: Negative.   All other systems reviewed and are negative.   Exam:   BP 110/64 (BP Location: Right Arm, Patient Position: Sitting, Cuff Size: Normal)   Pulse (!) 56   Resp 14   Ht 5' 2.5" (1.588 m)   Wt 142 lb 12.8 oz (64.8 kg)   BMI 25.70 kg/m   Weight change: @WEIGHTCHANGE @ Height:   Height: 5' 2.5" (158.8 cm)  Ht Readings from Last 3 Encounters:  11/28/17 5' 2.5" (1.588 m)  11/14/16 5' 2.5" (1.588 m)  10/31/15 5' 2.75" (1.594 m)    General appearance: alert, cooperative and appears stated age Head: Normocephalic, without obvious abnormality, atraumatic Neck: no adenopathy, supple, symmetrical, trachea midline and thyroid normal to  inspection and palpation Lungs: clear to auscultation bilaterally Cardiovascular: regular rate and rhythm Breasts: normal appearance, no masses or tenderness Abdomen: soft, non-tender; non distended,  no masses,  no organomegaly Extremities: extremities normal, atraumatic, no cyanosis or edema Skin: Skin color, texture, turgor normal. No rashes or lesions Lymph nodes: Cervical, supraclavicular, and axillary nodes normal. No abnormal inguinal nodes palpated Neurologic: Grossly normal   Pelvic: External genitalia:  no lesions             Urethra:  normal appearing urethra with no masses, tenderness or lesions              Bartholins and Skenes: normal                 Vagina: just inside the introitus is a small dark lesion, not  raised, ~4 mm, otherwise normal appearing vagina with normal color and discharge, no lesions              Cervix: no lesions               Bimanual Exam:  Uterus:  normal size, contour, position, consistency, mobility, non-tender              Adnexa: no mass, fullness, tenderness               Rectovaginal: Confirms               Anus:  normal sphincter tone, no lesions  Chaperone was present for exam.  A:  Well Woman with normal exam  Vaginal lesion  P:   Pap with hpv  Mammogram and colonoscopy are UTD  Discussed breast self exam  Discussed calcium and vit D intake  Labs through work and primary MD (lipids great)

## 2017-11-28 NOTE — Patient Instructions (Signed)
EXERCISE AND DIET:  We recommended that you start or continue a regular exercise program for good health. Regular exercise means any activity that makes your heart beat faster and makes you sweat.  We recommend exercising at least 30 minutes per day at least 3 days a week, preferably 4 or 5.  We also recommend a diet low in fat and sugar.  Inactivity, poor dietary choices and obesity can cause diabetes, heart attack, stroke, and kidney damage, among others.    ALCOHOL AND SMOKING:  Women should limit their alcohol intake to no more than 7 drinks/beers/glasses of wine (combined, not each!) per week. Moderation of alcohol intake to this level decreases your risk of breast cancer and liver damage. And of course, no recreational drugs are part of a healthy lifestyle.  And absolutely no smoking or even second hand smoke. Most people know smoking can cause heart and lung diseases, but did you know it also contributes to weakening of your bones? Aging of your skin?  Yellowing of your teeth and nails?  CALCIUM AND VITAMIN D:  Adequate intake of calcium and Vitamin D are recommended.  The recommendations for exact amounts of these supplements seem to change often, but generally speaking 600 mg of calcium (either carbonate or citrate) and 800 units of Vitamin D per day seems prudent. Certain women may benefit from higher intake of Vitamin D.  If you are among these women, your doctor will have told you during your visit.    PAP SMEARS:  Pap smears, to check for cervical cancer or precancers,  have traditionally been done yearly, although recent scientific advances have shown that most women can have pap smears less often.  However, every woman still should have a physical exam from her gynecologist every year. It will include a breast check, inspection of the vulva and vagina to check for abnormal growths or skin changes, a visual exam of the cervix, and then an exam to evaluate the size and shape of the uterus and  ovaries.  And after 56 years of age, a rectal exam is indicated to check for rectal cancers. We will also provide age appropriate advice regarding health maintenance, like when you should have certain vaccines, screening for sexually transmitted diseases, bone density testing, colonoscopy, mammograms, etc.   MAMMOGRAMS:  All women over 40 years old should have a yearly mammogram. Many facilities now offer a "3D" mammogram, which may cost around $50 extra out of pocket. If possible,  we recommend you accept the option to have the 3D mammogram performed.  It both reduces the number of women who will be called back for extra views which then turn out to be normal, and it is better than the routine mammogram at detecting truly abnormal areas.    COLONOSCOPY:  Colonoscopy to screen for colon cancer is recommended for all women at age 50.  We know, you hate the idea of the prep.  We agree, BUT, having colon cancer and not knowing it is worse!!  Colon cancer so often starts as a polyp that can be seen and removed at colonscopy, which can quite literally save your life!  And if your first colonoscopy is normal and you have no family history of colon cancer, most women don't have to have it again for 10 years.  Once every ten years, you can do something that may end up saving your life, right?  We will be happy to help you get it scheduled when you are ready.    Be sure to check your insurance coverage so you understand how much it will cost.  It may be covered as a preventative service at no cost, but you should check your particular policy.      Breast Self-Awareness Breast self-awareness means being familiar with how your breasts look and feel. It involves checking your breasts regularly and reporting any changes to your health care provider. Practicing breast self-awareness is important. A change in your breasts can be a sign of a serious medical problem. Being familiar with how your breasts look and feel allows  you to find any problems early, when treatment is more likely to be successful. All women should practice breast self-awareness, including women who have had breast implants. How to do a breast self-exam One way to learn what is normal for your breasts and whether your breasts are changing is to do a breast self-exam. To do a breast self-exam: Look for Changes  1. Remove all the clothing above your waist. 2. Stand in front of a mirror in a room with good lighting. 3. Put your hands on your hips. 4. Push your hands firmly downward. 5. Compare your breasts in the mirror. Look for differences between them (asymmetry), such as: ? Differences in shape. ? Differences in size. ? Puckers, dips, and bumps in one breast and not the other. 6. Look at each breast for changes in your skin, such as: ? Redness. ? Scaly areas. 7. Look for changes in your nipples, such as: ? Discharge. ? Bleeding. ? Dimpling. ? Redness. ? A change in position. Feel for Changes  Carefully feel your breasts for lumps and changes. It is best to do this while lying on your back on the floor and again while sitting or standing in the shower or tub with soapy water on your skin. Feel each breast in the following way:  Place the arm on the side of the breast you are examining above your head.  Feel your breast with the other hand.  Start in the nipple area and make  inch (2 cm) overlapping circles to feel your breast. Use the pads of your three middle fingers to do this. Apply light pressure, then medium pressure, then firm pressure. The light pressure will allow you to feel the tissue closest to the skin. The medium pressure will allow you to feel the tissue that is a little deeper. The firm pressure will allow you to feel the tissue close to the ribs.  Continue the overlapping circles, moving downward over the breast until you feel your ribs below your breast.  Move one finger-width toward the center of the body.  Continue to use the  inch (2 cm) overlapping circles to feel your breast as you move slowly up toward your collarbone.  Continue the up and down exam using all three pressures until you reach your armpit.  Write Down What You Find  Write down what is normal for each breast and any changes that you find. Keep a written record with breast changes or normal findings for each breast. By writing this information down, you do not need to depend only on memory for size, tenderness, or location. Write down where you are in your menstrual cycle, if you are still menstruating. If you are having trouble noticing differences in your breasts, do not get discouraged. With time you will become more familiar with the variations in your breasts and more comfortable with the exam. How often should I examine my breasts? Examine   your breasts every month. If you are breastfeeding, the best time to examine your breasts is after a feeding or after using a breast pump. If you menstruate, the best time to examine your breasts is 5-7 days after your period is over. During your period, your breasts are lumpier, and it may be more difficult to notice changes. When should I see my health care provider? See your health care provider if you notice:  A change in shape or size of your breasts or nipples.  A change in the skin of your breast or nipples, such as a reddened or scaly area.  Unusual discharge from your nipples.  A lump or thick area that was not there before.  Pain in your breasts.  Anything that concerns you.  This information is not intended to replace advice given to you by your health care provider. Make sure you discuss any questions you have with your health care provider. Document Released: 08/20/2005 Document Revised: 01/26/2016 Document Reviewed: 07/10/2015 Elsevier Interactive Patient Education  2018 [employer]  

## 2017-11-29 LAB — CYTOLOGY - PAP
DIAGNOSIS: NEGATIVE
HPV: NOT DETECTED

## 2017-12-03 ENCOUNTER — Telehealth: Payer: Self-pay

## 2017-12-03 DIAGNOSIS — N898 Other specified noninflammatory disorders of vagina: Secondary | ICD-10-CM

## 2017-12-03 NOTE — Telephone Encounter (Signed)
Left message to call Glenn Christo at [phone].  Need to schedule vulvar biopsy.

## 2017-12-11 NOTE — Telephone Encounter (Signed)
Left message to call Candida Vetter at [phone]. 

## 2017-12-12 ENCOUNTER — Ambulatory Visit: Payer: BLUE CROSS/BLUE SHIELD

## 2017-12-13 NOTE — Telephone Encounter (Signed)
Dr.Jertson, patient has not returned call to the office x 2 regarding scheduling vulvar biopsy. Please advise.  Cc: Madison Evans

## 2017-12-23 NOTE — Telephone Encounter (Signed)
Please send the patient a letter recommending she schedule her vulvar biopsy. Then close the encounter

## 2017-12-26 NOTE — Telephone Encounter (Signed)
Spoke with patient. Appointment for vulvar biopsy scheduled for 01/16/2018 at 8:15 am with Dr.Jertson. Patient declines all other appointments offered. [place]   Routing to provider for final review. Patient agreeable to disposition. Will close encounter.

## 2018-01-08 DIAGNOSIS — N62 Hypertrophy of breast: Secondary | ICD-10-CM | POA: Diagnosis not present

## 2018-01-16 ENCOUNTER — Ambulatory Visit (INDEPENDENT_AMBULATORY_CARE_PROVIDER_SITE_OTHER): Payer: BLUE CROSS/BLUE SHIELD | Admitting: Obstetrics and Gynecology

## 2018-01-16 ENCOUNTER — Encounter: Payer: Self-pay | Admitting: Obstetrics and Gynecology

## 2018-01-16 ENCOUNTER — Other Ambulatory Visit: Payer: Self-pay

## 2018-01-16 DIAGNOSIS — N905 Atrophy of vulva: Secondary | ICD-10-CM | POA: Diagnosis not present

## 2018-01-16 DIAGNOSIS — N9089 Other specified noninflammatory disorders of vulva and perineum: Secondary | ICD-10-CM | POA: Diagnosis not present

## 2018-01-16 NOTE — Patient Instructions (Signed)

## 2018-01-16 NOTE — Progress Notes (Signed)
GYNECOLOGY  VISIT   HPI: 57 y.o.   Married  Caucasian  female   G2P2002 with No LMP recorded. Patient is premenopausal.   here for Vulvar BX. She was noted to have a dark lesion just inside the introitus on the right at her annual exam.     GYNECOLOGIC HISTORY: No LMP recorded. Patient is premenopausal. Contraception:vasectomy  Menopausal hormone therapy: none         OB History    Gravida  2   Para  2   Term  2   Preterm  0   AB  0   Living  2     SAB  0   TAB  0   Ectopic  0   Multiple  0   Live Births  2              Patient Active Problem List   Diagnosis Date Noted  . History of migraine headaches 10/01/2014  . History of abnormal cervical Pap smear 10/26/2013  . Urge incontinence of urine 10/26/2013    Past Medical History:  Diagnosis Date  . Abnormal Pap smear of cervix 11/97   LEEP, CIN III  . Acid reflux   . MVP (mitral valve prolapse)   . Vertigo     Past Surgical History:  Procedure Laterality Date  . CERVICAL BIOPSY  W/ LOOP ELECTRODE EXCISION  11/97   CIN III  . COLPOSCOPY  11/97   CIN III  . HYSTEROSCOPY  02/2003   benign polyps    Current Outpatient Medications  Medication Sig Dispense Refill  . ACZONE 5 % topical gel Apply topically 2 (two) times daily.    . Calcium Carbonate-Vit D-Min (CALCIUM 600+D3 PLUS MINERALS) 600-800 MG-UNIT CHEW Chew 1 tablet by mouth daily.    Marland Kitchen glucosamine-chondroitin 500-400 MG tablet Take 1 tablet by mouth daily.    . Ibuprofen (ADVIL) 200 MG CAPS Take 1 capsule by mouth.    . Multiple Vitamin (MULTI-VITAMINS) TABS Take by mouth.    . Probiotic Product (PROBIOTIC-10 ULTIMATE) CAPS Take 1 tablet by mouth daily.     No current facility-administered medications for this visit.      ALLERGIES: Patient has no known allergies.  Family History  Problem Relation Age of Onset  . Osteoporosis Mother   . Diabetes Sister   . Osteoporosis Maternal Grandmother     Social History   Socioeconomic  History  . Marital status: Married    Spouse name: Not on file  . Number of children: 2  . Years of education: Not on file  . Highest education level: Not on file  Occupational History  . Not on file  Social Needs  . Financial resource strain: Not on file  . Food insecurity:    Worry: Not on file    Inability: Not on file  . Transportation needs:    Medical: Not on file    Non-medical: Not on file  Tobacco Use  . Smoking status: Never Smoker  . Smokeless tobacco: Never Used  Substance and Sexual Activity  . Alcohol use: Yes    Alcohol/week: 0.6 oz    Types: 1 Glasses of wine per week    Comment: occ  . Drug use: No  . Sexual activity: Yes    Partners: Male    Birth control/protection: Surgical    Comment: vasectomy  Lifestyle  . Physical activity:    Days per week: Not on file    Minutes per  session: Not on file  . Stress: Not on file  Relationships  . Social connections:    Talks on phone: Not on file    Gets together: Not on file    Attends religious service: Not on file    Active member of club or organization: Not on file    Attends meetings of clubs or organizations: Not on file    Relationship status: Not on file  . Intimate partner violence:    Fear of current or ex partner: Not on file    Emotionally abused: Not on file    Physically abused: Not on file    Forced sexual activity: Not on file  Other Topics Concern  . Not on file  Social History Narrative  . Not on file    Review of Systems  Constitutional: Negative.   HENT: Negative.   Eyes: Negative.   Respiratory: Negative.   Cardiovascular: Negative.   Gastrointestinal: Negative.   Genitourinary: Negative.   Musculoskeletal: Negative.   Skin: Negative.   Neurological: Negative.   Endo/Heme/Allergies: Negative.   Psychiatric/Behavioral: Negative.     PHYSICAL EXAMINATION:    BP 102/60 (BP Location: Right Arm, Patient Position: Sitting, Cuff Size: Normal)   Pulse (!) 56   Resp 14   Wt  142 lb (64.4 kg)   BMI 25.56 kg/m     General appearance: alert, cooperative and appears stated age  Pelvic: External genitalia: Just inside the introitus on the right is a 3-4 mm dark, flat lesion.               Urethra:  normal appearing urethra with no masses, tenderness or lesions              Bartholins and Skenes: normal                  The risks of the procedure were reviewed with the patient and a consent was signed. The area was cleansed with betadine and injected with 1% lidocaine. A 4 mm punch biopsy was used to remove a circular piece of tissue. The defect was closed with 4-0 vicryl, the area was oozing so 3 stitches were placed. The patient tolerated the procedure well.   Chaperone was present for exam.  ASSESSMENT Vulvar/vaginal lesion    PLAN Biopsy done Further plans depending on results.   An After Visit Summary was printed and given to the patient.

## 2018-02-20 ENCOUNTER — Telehealth: Payer: Self-pay | Admitting: Obstetrics and Gynecology

## 2018-02-20 ENCOUNTER — Encounter: Payer: Self-pay | Admitting: Obstetrics and Gynecology

## 2018-02-20 NOTE — Telephone Encounter (Signed)
Patient sent the following correspondence through MyChart. Routing to triage to assist patient with request.  ----- Message from Mychart, Generic sent at 02/20/2018 12:13 PM EDT -----    Hi. I have two questions.  1. Since I had the procedure done in your office over a month ago, sexual intercourse has been very uncomfortable, as is I might tear. Any suggestions? Will this improve over time?  2. I believe in my last annual appointment this year, you said I was post menopause and that I didn't need to take anything since I wasn't having any problems. Since then, some night sweats and hot flashes have come back as well as some serious memory lapses. Is this normal? Is there anything mild I can take or something I need to add to my diet to help with that?    Thanks!

## 2018-02-20 NOTE — Telephone Encounter (Signed)
Spoke with patient. Patient reports discomfort with intercourse since vaginal bx on 01/16/18. Feels burning and like the skin is going to split. Is using lubricant, no change. Denies vaginal D/C, odor or bleeding.   Patient also request to discuss possible med options for vasomotor symptoms. Hot flashes and night sweats. Describes "memory lapse" as calling people/clients/employer by wrong name.   Recommended OV for further further evaluation and discussion. OV scheduled for 02/27/18 at 11:30am. Patient declined earlier appointments offered.   Routing to provider for final review. Patient is agreeable to disposition. Will close encounter.

## 2018-02-27 ENCOUNTER — Encounter: Payer: Self-pay | Admitting: Obstetrics and Gynecology

## 2018-02-27 ENCOUNTER — Ambulatory Visit (INDEPENDENT_AMBULATORY_CARE_PROVIDER_SITE_OTHER): Payer: BLUE CROSS/BLUE SHIELD | Admitting: Obstetrics and Gynecology

## 2018-02-27 ENCOUNTER — Other Ambulatory Visit: Payer: Self-pay

## 2018-02-27 VITALS — BP 100/60 | HR 56 | Resp 14 | Wt 144.0 lb

## 2018-02-27 DIAGNOSIS — N952 Postmenopausal atrophic vaginitis: Secondary | ICD-10-CM

## 2018-02-27 DIAGNOSIS — N951 Menopausal and female climacteric states: Secondary | ICD-10-CM

## 2018-02-27 DIAGNOSIS — N941 Unspecified dyspareunia: Secondary | ICD-10-CM

## 2018-02-27 MED ORDER — ESTRADIOL 0.1 MG/GM VA CREA: TOPICAL_CREAM | VAGINAL | 0 refills | Status: DC

## 2018-02-27 MED ORDER — LIDOCAINE 5 % EX OINT: TOPICAL_OINTMENT | CUTANEOUS | 0 refills | Status: DC

## 2018-02-27 NOTE — Patient Instructions (Signed)
You can try estroven during the day or estroven pm for sleep.

## 2018-02-27 NOTE — Progress Notes (Signed)
GYNECOLOGY  VISIT   HPI: 57 y.o.   Married  Caucasian  female   [id number] with Patient's last menstrual period was 08/06/2016.   here c/o menopausal symptoms including hot flashes, night sweats, painful intercourse and trouble sleeping.      She has a biopsy She has started having hot flashes a couple of x a day and intermittent night sweats (not nightly). Not sleeping as well.  She had vasomotor previously, always mild. Recent symptoms are worse. No vaginal bleeding.   GYNECOLOGIC HISTORY: Patient's last menstrual period was 08/06/2016. Contraception:postmenopause  Menopausal hormone therapy: none         OB History    Gravida  2   Para  2   Term  2   Preterm  0   AB  0   Living  2     SAB  0   TAB  0   Ectopic  0   Multiple  0   Live Births  2              Patient Active Problem List   Diagnosis Date Noted  . History of migraine headaches 10/01/2014  . History of abnormal cervical Pap smear 10/26/2013  . Urge incontinence of urine 10/26/2013    Past Medical History:  Diagnosis Date  . Abnormal Pap smear of cervix 11/97   LEEP, CIN III  . Acid reflux   . MVP (mitral valve prolapse)   . Vertigo     Past Surgical History:  Procedure Laterality Date  . CERVICAL BIOPSY  W/ LOOP ELECTRODE EXCISION  11/97   CIN III  . COLPOSCOPY  11/97   CIN III  . HYSTEROSCOPY  02/2003   benign polyps    Current Outpatient Medications  Medication Sig Dispense Refill  . Calcium Carbonate-Vit D-Min (CALCIUM 600+D3 PLUS MINERALS) 600-800 MG-UNIT CHEW Chew 1 tablet by mouth daily.    Marland Kitchen. glucosamine-chondroitin 500-400 MG tablet Take 1 tablet by mouth daily.    . Ibuprofen (ADVIL) 200 MG CAPS Take 1 capsule by mouth.    . Multiple Vitamin (MULTI-VITAMINS) TABS Take by mouth.    . Probiotic Product (PROBIOTIC-10 ULTIMATE) CAPS Take 1 tablet by mouth daily.     No current facility-administered medications for this visit.      ALLERGIES: Patient has no known  allergies.  Family History  Problem Relation Age of Onset  . Osteoporosis Mother   . Diabetes Sister   . Osteoporosis Maternal Grandmother     Social History   Socioeconomic History  . Marital status: Married    Spouse name: Not on file  . Number of children: 2  . Years of education: Not on file  . Highest education level: Not on file  Occupational History  . Not on file  Social Needs  . Financial resource strain: Not on file  . Food insecurity:    Worry: Not on file    Inability: Not on file  . Transportation needs:    Medical: Not on file    Non-medical: Not on file  Tobacco Use  . Smoking status: Never Smoker  . Smokeless tobacco: Never Used  Substance and Sexual Activity  . Alcohol use: Yes    Alcohol/week: 0.6 oz    Types: 1 Glasses of wine per week    Comment: occ  . Drug use: No  . Sexual activity: Yes    Partners: Male    Birth control/protection: Surgical    Comment: vasectomy  Lifestyle  . Physical activity:    Days per week: Not on file    Minutes per session: Not on file  . Stress: Not on file  Relationships  . Social connections:    Talks on phone: Not on file    Gets together: Not on file    Attends religious service: Not on file    Active member of club or organization: Not on file    Attends meetings of clubs or organizations: Not on file    Relationship status: Not on file  . Intimate partner violence:    Fear of current or ex partner: Not on file    Emotionally abused: Not on file    Physically abused: Not on file    Forced sexual activity: Not on file  Other Topics Concern  . Not on file  Social History Narrative  . Not on file    Review of Systems  Constitutional: Negative.   HENT: Negative.   Eyes: Negative.   Respiratory: Negative.   Cardiovascular: Negative.   Gastrointestinal: Negative.   Genitourinary:       Hot flashes Night sweats Painful intercourse   Musculoskeletal: Negative.   Skin: Negative.   Neurological:  Negative.   Endo/Heme/Allergies: Negative.   Psychiatric/Behavioral: Negative.     PHYSICAL EXAMINATION:    BP 100/60 (BP Location: Right Arm, Patient Position: Sitting, Cuff Size: Normal)   Pulse (!) 56   Resp 14   Wt 144 lb (65.3 kg)   LMP 08/06/2016   BMI 25.92 kg/m     General appearance: alert, cooperative and appears stated age  Pelvic: External genitalia:  no lesions, has healed well from her prior biopsy. Tender just at the opening of the vagina on the right and left side of midline.               Urethra:  normal appearing urethra with no masses, tenderness or lesions              Bartholins and Skenes: normal                 Vagina: normal atropic appearing vagina with normal color and discharge, no lesions. Slightly tender at the introitus with insertion of 2 fingers.               Cervix: no lesions              Bimanual Exam:  Uterus:  normal size, contour, position, consistency, mobility, non-tender              Adnexa: no mass, fullness, tenderness               Chaperone was present for exam.  ASSESSMENT Vasomotor symptoms, recently worsening.  New dyspareunia, since punch biopsy on the right side, just inside the introitus. Tender on the right and left Vaginal atrophy, doesn't have any vaginal discomfort with intercourse    PLAN Discussed avoiding triggers Try estroven and/or estroven pm Use estrogen cream topically every day for a week, then 2 x a week as needed Lidocaine ointment for dyspareunia Call if not improving    An After Visit Summary was printed and given to the patient.

## 2018-03-10 DIAGNOSIS — J45909 Unspecified asthma, uncomplicated: Secondary | ICD-10-CM | POA: Diagnosis not present

## 2018-03-10 DIAGNOSIS — J209 Acute bronchitis, unspecified: Secondary | ICD-10-CM | POA: Diagnosis not present

## 2018-03-10 DIAGNOSIS — R05 Cough: Secondary | ICD-10-CM | POA: Diagnosis not present

## 2018-03-17 ENCOUNTER — Telehealth: Payer: Self-pay | Admitting: *Deleted

## 2018-03-17 NOTE — Telephone Encounter (Signed)
Left message to call Noreene LarssonJill at [phone].   [facility] lidoc[facility]int[facility], denied.

## 2018-03-21 NOTE — Telephone Encounter (Signed)
Patient returned call. Will not be available until after 1 pm.

## 2018-03-21 NOTE — Telephone Encounter (Signed)
Spoke with patient, advised as seen below regarding PA for lidocaine. Patient states she has been sick with Bronchitis, has not had intercourse, has not needed lidocaine. States she will resume intercourse after she is feeling better, will advise office if she would like to proceed with appeal process or alternative medication. Patient thankful for return call.   Routing to provider for final review. Patient is agreeable to disposition. Will close encounter.

## 2018-04-10 DIAGNOSIS — R05 Cough: Secondary | ICD-10-CM | POA: Diagnosis not present

## 2018-05-15 DIAGNOSIS — L820 Inflamed seborrheic keratosis: Secondary | ICD-10-CM | POA: Diagnosis not present

## 2018-05-15 DIAGNOSIS — Z1283 Encounter for screening for malignant neoplasm of skin: Secondary | ICD-10-CM | POA: Diagnosis not present

## 2018-05-15 DIAGNOSIS — D225 Melanocytic nevi of trunk: Secondary | ICD-10-CM | POA: Diagnosis not present

## 2018-05-15 DIAGNOSIS — L578 Other skin changes due to chronic exposure to nonionizing radiation: Secondary | ICD-10-CM | POA: Diagnosis not present

## 2018-05-15 DIAGNOSIS — D485 Neoplasm of uncertain behavior of skin: Secondary | ICD-10-CM | POA: Diagnosis not present

## 2018-06-26 ENCOUNTER — Other Ambulatory Visit: Payer: Self-pay | Admitting: Internal Medicine

## 2018-06-26 DIAGNOSIS — Z1231 Encounter for screening mammogram for malignant neoplasm of breast: Secondary | ICD-10-CM

## 2018-07-14 ENCOUNTER — Ambulatory Visit
Admission: RE | Admit: 2018-07-14 | Discharge: 2018-07-14 | Disposition: A | Discharge status: Home / Self Care | Payer: BLUE CROSS/BLUE SHIELD | Source: Ambulatory Visit | Attending: Internal Medicine | Admitting: Internal Medicine

## 2018-07-14 DIAGNOSIS — Z23 Encounter for immunization: Secondary | ICD-10-CM | POA: Diagnosis not present

## 2018-07-14 DIAGNOSIS — Z1231 Encounter for screening mammogram for malignant neoplasm of breast: Secondary | ICD-10-CM

## 2018-10-12 IMAGING — MR MR SHOULDER*L* W/O CM
4 of 5 series · 30 of 40 positions shown · non-contrast
Comparison: None.

CLINICAL DATA: Left shoulder pain.  Upper arm pain with moving.

EXAM:
MRI OF THE LEFT SHOULDER WITHOUT CONTRAST
TECHNIQUE: Multiplanar, multisequence MR imaging of the shoulder was performed.
No intravenous contrast was administered.

[Series 3: T2 fat-sat · axial · 4.0mm · 0.55mm/px · z∈[-56,+19]mm · 8 of 17 slices shown (1 of 3)]
[im 1/17]
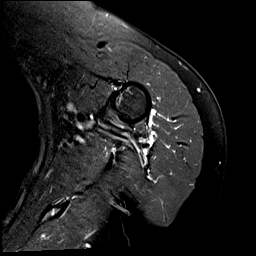
[im 3/17]
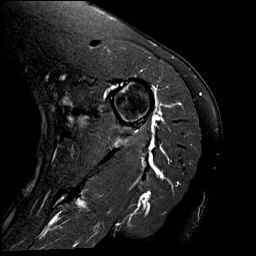
[im 5/17]
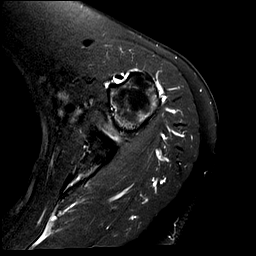
[im 7/17]
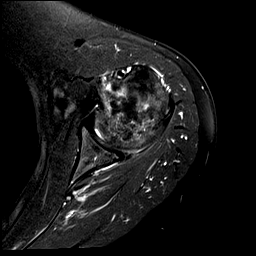
[im 10/17]
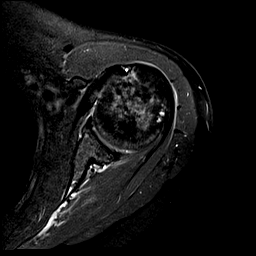
[im 12/17]
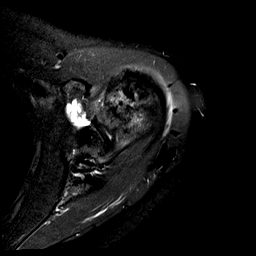
[im 14/17]
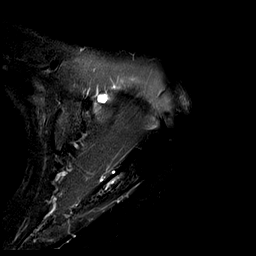
[im 17/17]
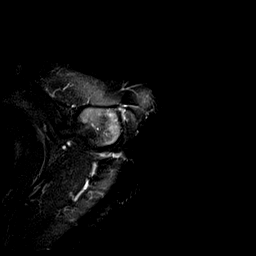

[Series 5: T2 fat-sat · coronal · 4.0mm · 0.59mm/px · 8 of 15 slices shown (2 of 3)]
[im 1/15]
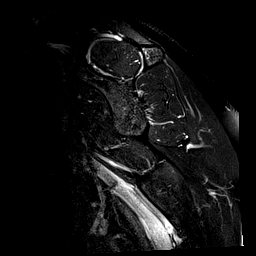
[im 3/15]
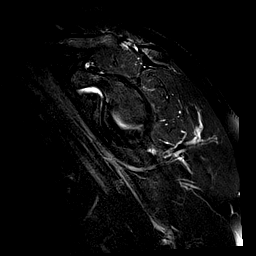
[im 5/15]
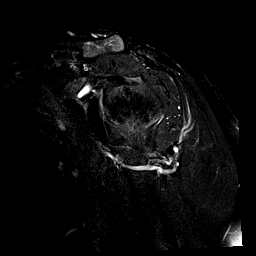
[im 7/15]
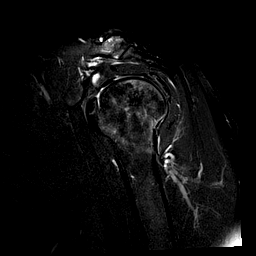
[im 9/15]
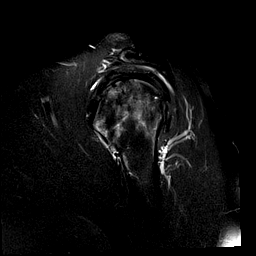
[im 11/15]
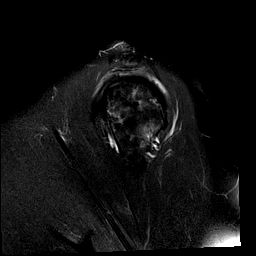
[im 13/15]
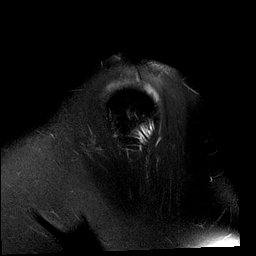
[im 15/15]
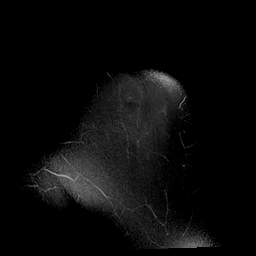

[Series 8: PD · sagittal · 4.0mm · 0.29mm/px · 8 of 16 slices shown]
[im 1/16]
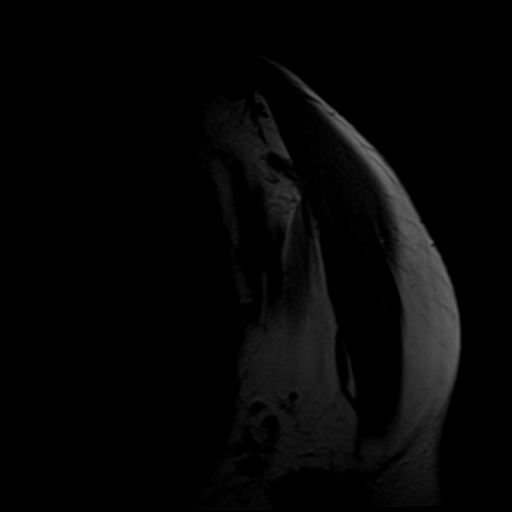
[im 3/16]
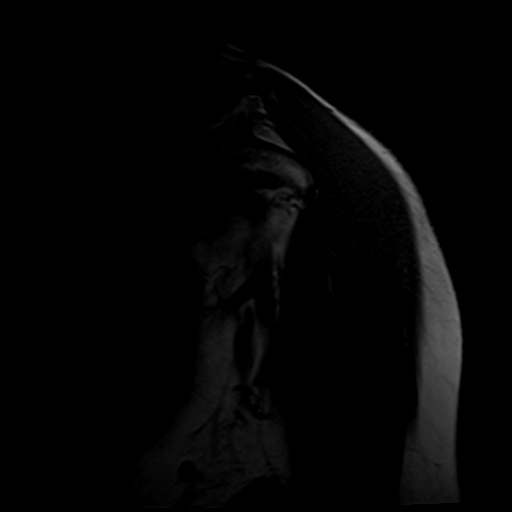
[im 5/16]
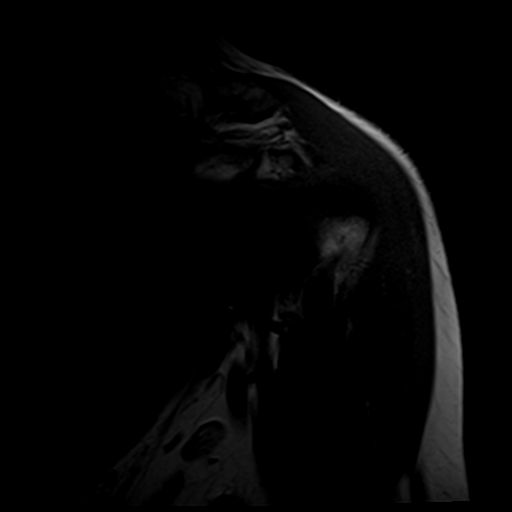
[im 7/16]
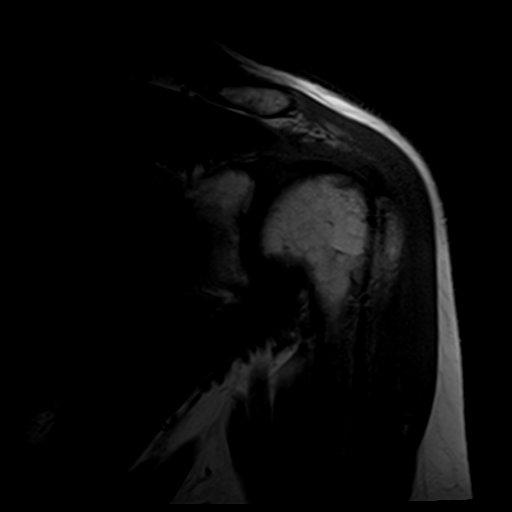
[im 9/16]
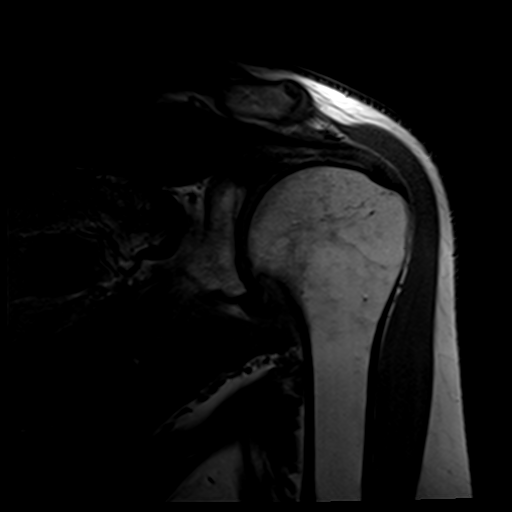
[im 11/16]
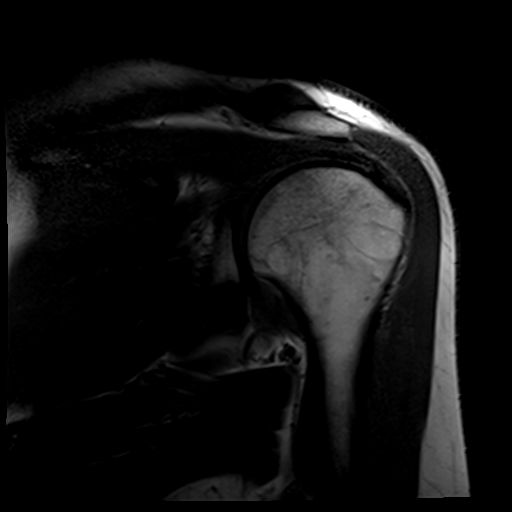
[im 13/16]
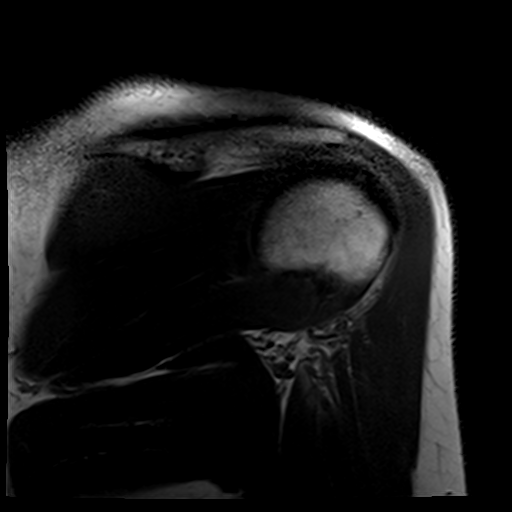
[im 16/16]
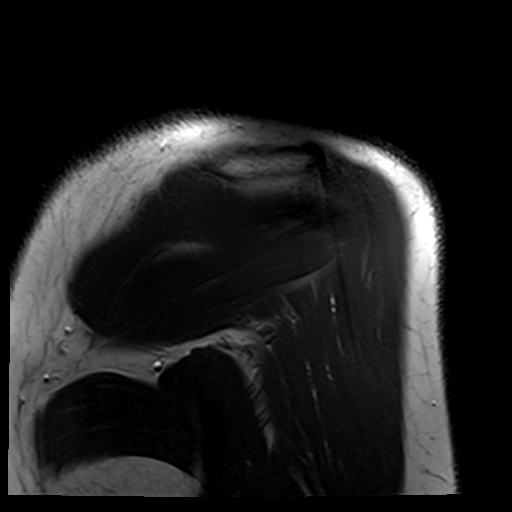

[Series 10: T2 fat-sat · sagittal · 4.0mm · 0.29mm/px · 6 of 16 slices shown (3 of 3)]
[im 1/16]
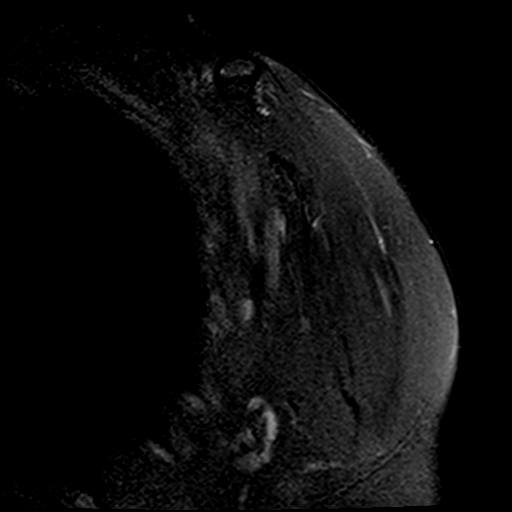
[im 3/16]
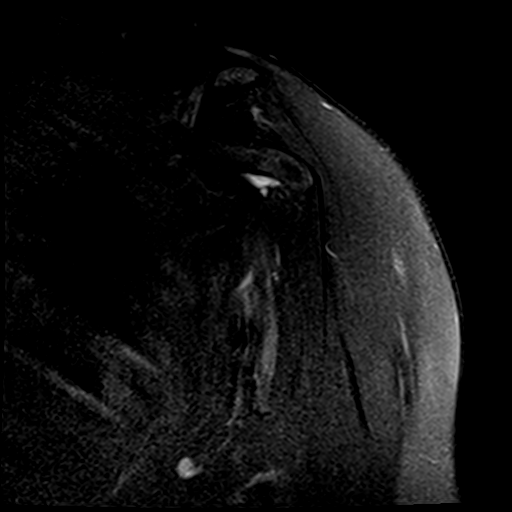
[im 5/16]
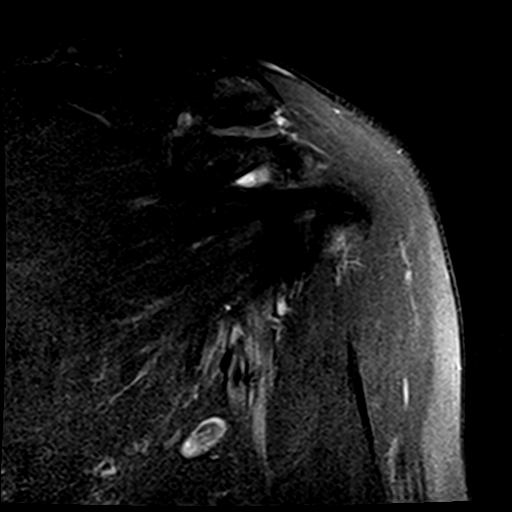
[im 7/16]
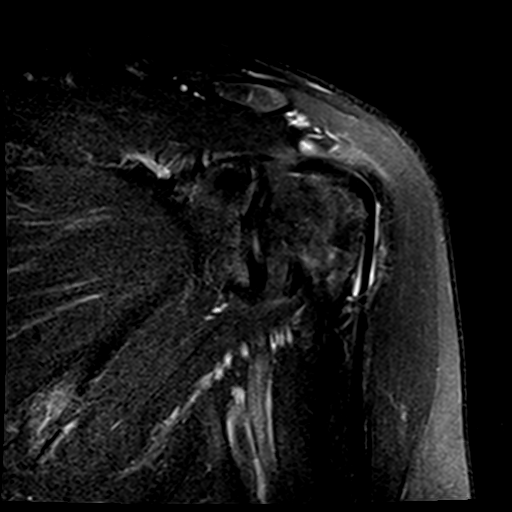
[im 9/16]
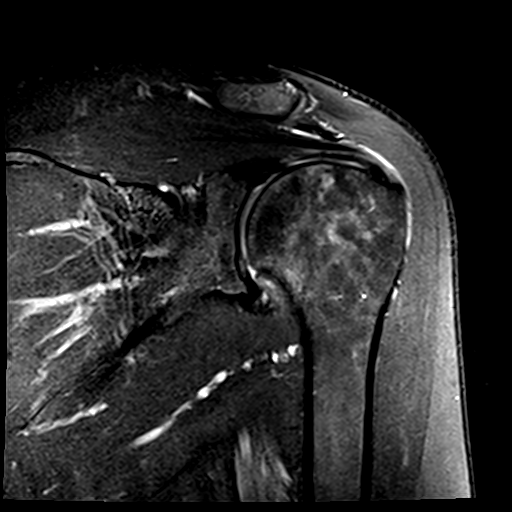
[im 13/16]
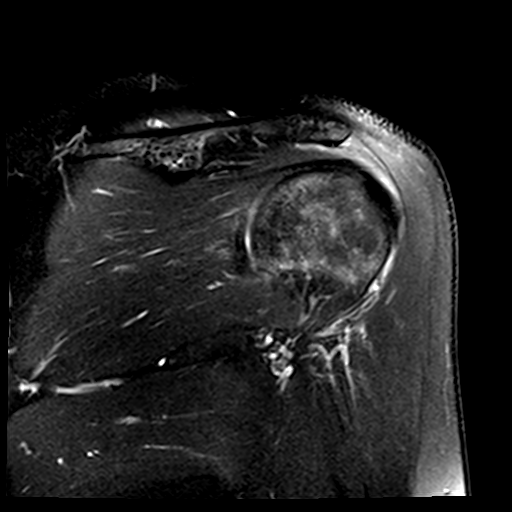

[30 of 40 positions shown; findings below may reference images not displayed]

FINDINGS: Rotator cuff: Mild tendinosis of the supraspinatus tendon without a
tear. Infraspinatus tendon is intact. Teres minor tendon is intact.
Subscapularis tendon is intact.

Muscles: No atrophy or fatty replacement of nor abnormal signal
within, the muscles of the rotator cuff.

Biceps long head:  Intact.

Acromioclavicular Joint: Mild arthropathy of the acromioclavicular
joint. Type I acromion. Small amount of subacromial/subdeltoid
bursal fluid.

Glenohumeral Joint: No joint effusion.  No chondral defect.

Labrum: Grossly intact, but evaluation is limited by lack of
intraarticular fluid.

Bones: No focal marrow signal abnormality. No fracture or
dislocation.

Other: No fluid collection or hematoma.
IMPRESSION: 1. Mild tendinosis of the supraspinatus tendon without a tear.
2. Mild subacromial/subdeltoid bursitis.

## 2018-10-30 DIAGNOSIS — G4733 Obstructive sleep apnea (adult) (pediatric): Secondary | ICD-10-CM | POA: Diagnosis not present

## 2018-10-30 DIAGNOSIS — Z79899 Other long term (current) drug therapy: Secondary | ICD-10-CM | POA: Diagnosis not present

## 2018-10-30 DIAGNOSIS — R05 Cough: Secondary | ICD-10-CM | POA: Diagnosis not present

## 2018-10-30 DIAGNOSIS — Z8669 Personal history of other diseases of the nervous system and sense organs: Secondary | ICD-10-CM | POA: Diagnosis not present

## 2018-10-30 DIAGNOSIS — Z0000 Encounter for general adult medical examination without abnormal findings: Secondary | ICD-10-CM | POA: Diagnosis not present

## 2018-10-30 DIAGNOSIS — E7800 Pure hypercholesterolemia, unspecified: Secondary | ICD-10-CM | POA: Diagnosis not present

## 2018-11-12 DIAGNOSIS — G4733 Obstructive sleep apnea (adult) (pediatric): Secondary | ICD-10-CM | POA: Diagnosis not present

## 2018-11-16 DIAGNOSIS — G4733 Obstructive sleep apnea (adult) (pediatric): Secondary | ICD-10-CM | POA: Diagnosis not present

## 2018-12-25 ENCOUNTER — Ambulatory Visit: Payer: BLUE CROSS/BLUE SHIELD | Admitting: Obstetrics and Gynecology

## 2019-03-18 ENCOUNTER — Ambulatory Visit (INDEPENDENT_AMBULATORY_CARE_PROVIDER_SITE_OTHER): Payer: BC Managed Care – PPO | Admitting: Obstetrics and Gynecology

## 2019-03-18 ENCOUNTER — Other Ambulatory Visit: Payer: Self-pay

## 2019-03-18 ENCOUNTER — Encounter: Payer: Self-pay | Admitting: Obstetrics and Gynecology

## 2019-03-18 VITALS — BP 108/68 | HR 72 | Temp 98.4°F | Resp 12 | Ht 62.0 in | Wt 142.0 lb

## 2019-03-18 DIAGNOSIS — R3915 Urgency of urination: Secondary | ICD-10-CM

## 2019-03-18 DIAGNOSIS — Z01419 Encounter for gynecological examination (general) (routine) without abnormal findings: Secondary | ICD-10-CM | POA: Diagnosis not present

## 2019-03-18 DIAGNOSIS — N3942 Incontinence without sensory awareness: Secondary | ICD-10-CM

## 2019-03-18 LAB — POCT URINALYSIS DIPSTICK
Bilirubin, UA: NEGATIVE
Blood, UA: NEGATIVE
Glucose, UA: NEGATIVE
Ketones, UA: NEGATIVE
Leukocytes, UA: NEGATIVE
Nitrite, UA: NEGATIVE
Protein, UA: NEGATIVE
Urobilinogen, UA: 0.2 E.U./dL
pH, UA: 5 (ref 5.0–8.0)

## 2019-03-18 NOTE — Patient Instructions (Addendum)
I would recommend a mediterranean diet.  A mediterranean diet is high in fruits, vegetables, whole grains, fish, chicken, nuts, healthy fats (olive oil or canola oil). Low fat dairy. Limit butter, margarine, red meat and sweets.    EXERCISE AND DIET:  We recommended that you start or continue a regular exercise program for good health. Regular exercise means any activity that makes your heart beat faster and makes you sweat.  We recommend exercising at least 30 minutes per day at least 3 days a week, preferably 4 or 5.  We also recommend a diet low in fat and sugar.  Inactivity, poor dietary choices and obesity can cause diabetes, heart attack, stroke, and kidney damage, among others.    ALCOHOL AND SMOKING:  Women should limit their alcohol intake to no more than 7 drinks/beers/glasses of wine (combined, not each!) per week. Moderation of alcohol intake to this level decreases your risk of breast cancer and liver damage. And of course, no recreational drugs are part of a healthy lifestyle.  And absolutely no smoking or even second hand smoke. Most people know smoking can cause heart and lung diseases, but did you know it also contributes to weakening of your bones? Aging of your skin?  Yellowing of your teeth and nails?  CALCIUM AND VITAMIN D:  Adequate intake of calcium and Vitamin D are recommended.  The recommendations for exact amounts of these supplements seem to change often, but generally speaking 1,200 mg of calcium (between diet and supplement) and 800 units of Vitamin D per day seems prudent. Certain women may benefit from higher intake of Vitamin D.  If you are among these women, your doctor will have told you during your visit.    PAP SMEARS:  Pap smears, to check for cervical cancer or precancers,  have traditionally been done yearly, although recent scientific advances have shown that most women can have pap smears less often.  However, every woman still should have a physical exam from her  gynecologist every year. It will include a breast check, inspection of the vulva and vagina to check for abnormal growths or skin changes, a visual exam of the cervix, and then an exam to evaluate the size and shape of the uterus and ovaries.  And after 57 years of age, a rectal exam is indicated to check for rectal cancers. We will also provide age appropriate advice regarding health maintenance, like when you should have certain vaccines, screening for sexually transmitted diseases, bone density testing, colonoscopy, mammograms, etc.   MAMMOGRAMS:  All women over 216 years old should have a yearly mammogram. Many facilities now offer a "3D" mammogram, which may cost around $50 extra out of pocket. If possible,  we recommend you accept the option to have the 3D mammogram performed.  It both reduces the number of women who will be called back for extra views which then turn out to be normal, and it is better than the routine mammogram at detecting truly abnormal areas.    COLON CANCER SCREENING: Now recommend starting at age 57. At this time colonoscopy is not covered for routine screening until 50. There are take home tests that can be done between 45-49.   COLONOSCOPY:  Colonoscopy to screen for colon cancer is recommended for all women at age 57.  We know, you hate the idea of the prep.  We agree, BUT, having colon cancer and not knowing it is worse!!  Colon cancer so often starts as a polyp that  can be seen and removed at colonscopy, which can quite literally save your life!  And if your first colonoscopy is normal and you have no family history of colon cancer, most women don't have to have it again for 10 years.  Once every ten years, you can do something that may end up saving your life, right?  We will be happy to help you get it scheduled when you are ready.  Be sure to check your insurance coverage so you understand how much it will cost.  It may be covered as a preventative service at no cost, but  you should check your particular policy.      Breast Self-Awareness Breast self-awareness means being familiar with how your breasts look and feel. It involves checking your breasts regularly and reporting any changes to your health care provider. Practicing breast self-awareness is important. A change in your breasts can be a sign of a serious medical problem. Being familiar with how your breasts look and feel allows you to find any problems early, when treatment is more likely to be successful. All women should practice breast self-awareness, including women who have had breast implants. How to do a breast self-exam One way to learn what is normal for your breasts and whether your breasts are changing is to do a breast self-exam. To do a breast self-exam: Look for Changes  1. Remove all the clothing above your waist. 2. Stand in front of a mirror in a room with good lighting. 3. Put your hands on your hips. 4. Push your hands firmly downward. 5. Compare your breasts in the mirror. Look for differences between them (asymmetry), such as: ? Differences in shape. ? Differences in size. ? Puckers, dips, and bumps in one breast and not the other. 6. Look at each breast for changes in your skin, such as: ? Redness. ? Scaly areas. 7. Look for changes in your nipples, such as: ? Discharge. ? Bleeding. ? Dimpling. ? Redness. ? A change in position. Feel for Changes Carefully feel your breasts for lumps and changes. It is best to do this while lying on your back on the floor and again while sitting or standing in the shower or tub with soapy water on your skin. Feel each breast in the following way:  Place the arm on the side of the breast you are examining above your head.  Feel your breast with the other hand.  Start in the nipple area and make  inch (2 cm) overlapping circles to feel your breast. Use the pads of your three middle fingers to do this. Apply light pressure, then medium  pressure, then firm pressure. The light pressure will allow you to feel the tissue closest to the skin. The medium pressure will allow you to feel the tissue that is a little deeper. The firm pressure will allow you to feel the tissue close to the ribs.  Continue the overlapping circles, moving downward over the breast until you feel your ribs below your breast.  Move one finger-width toward the center of the body. Continue to use the  inch (2 cm) overlapping circles to feel your breast as you move slowly up toward your collarbone.  Continue the up and down exam using all three pressures until you reach your armpit.  Write Down What You Find  Write down what is normal for each breast and any changes that you find. Keep a written record with breast changes or normal findings for each breast.  By writing this information down, you do not need to depend only on memory for size, tenderness, or location. Write down where you are in your menstrual cycle, if you are still menstruating. If you are having trouble noticing differences in your breasts, do not get discouraged. With time you will become more familiar with the variations in your breasts and more comfortable with the exam. How often should I examine my breasts? Examine your breasts every month. If you are breastfeeding, the best time to examine your breasts is after a feeding or after using a breast pump. If you menstruate, the best time to examine your breasts is 5-7 days after your period is over. During your period, your breasts are lumpier, and it may be more difficult to notice changes. When should I see my health care provider? See your health care provider if you notice:  A change in shape or size of your breasts or nipples.  A change in the skin of your breast or nipples, such as a reddened or scaly area.  Unusual discharge from your nipples.  A lump or thick area that was not there before.  Pain in your breasts.  Anything that  concerns you.   Kegel Exercises  Kegel exercises can help strengthen your pelvic floor muscles. The pelvic floor is a group of muscles that support your rectum, small intestine, and bladder. In females, pelvic floor muscles also help support the womb (uterus). These muscles help you control the flow of urine and stool. Kegel exercises are painless and simple, and they do not require any equipment. Your provider may suggest Kegel exercises to:  Improve bladder and bowel control.  Improve sexual response.  Improve weak pelvic floor muscles after surgery to remove the uterus (hysterectomy) or pregnancy (females).  Improve weak pelvic floor muscles after prostate gland removal or surgery (males). Kegel exercises involve squeezing your pelvic floor muscles, which are the same muscles you squeeze when you try to stop the flow of urine or keep from passing gas. The exercises can be done while sitting, standing, or lying down, but it is best to vary your position. Exercises How to do Kegel exercises: 1. Squeeze your pelvic floor muscles tight. You should feel a tight lift in your rectal area. If you are a female, you should also feel a tightness in your vaginal area. Keep your stomach, buttocks, and legs relaxed. 2. Hold the muscles tight for up to 10 seconds. 3. Breathe normally. 4. Relax your muscles. 5. Repeat as told by your health care provider. Repeat this exercise daily as told by your health care provider. Continue to do this exercise for at least 4-6 weeks, or for as long as told by your health care provider. You may be referred to a physical therapist who can help you learn more about how to do Kegel exercises. Depending on your condition, your health care provider may recommend:  Varying how long you squeeze your muscles.  Doing several sets of exercises every day.  Doing exercises for several weeks.  Making Kegel exercises a part of your regular exercise routine. This  information is not intended to replace advice given to you by your health care provider. Make sure you discuss any questions you have with your health care provider. Document Released: 08/06/2012 Document Revised: 04/09/2018 Document Reviewed: 04/09/2018 Elsevier Patient Education  2020 [employer].

## 2019-03-18 NOTE — Progress Notes (Signed)
58 y.o. G17P2002 Married White or Caucasian Not Hispanic or Latino female here for annual exam.   She is having some vasomotor symptoms. She is working out regularly. C/O increased weight around her abdomen, no actual weight gain.  Recently diagnosed with sleep apnea. She has trouble falling asleep. Not stressing. Waking up 2-3 x a night to void, trouble going back to sleep afterwards. Drinks one cup of coffee in the am. Drinks 64 oz of water a day, stops drinking water ~2.5 hours prior to going to sleep. Mostly voids normal amounts. Sometimes double voids.  She has urinary incontinence. Constantly leaking during the day, can dribble a little after she voids. Will leak small amounts with coughing or sneezing. Has urge incontinence, small amounts, not frequent.  The minipad is wet, she doesn't feel herself leak. Mostly just notices the wet pad (mini-pad).      Patient's last menstrual period was 08/06/2016.          Sexually active: Yes.    The current method of family planning is post menopausal status.    Exercising: Yes.    boot camp  Smoker:  Former smoker   Health Maintenance: Pap: 11/28/2017 WNL NEG HPV, 10-27-14 Neg:Neg HR HPV, 10-26-13 Neg History of abnormal Pap:  Yes, Hx LEEP 1997 CIN III MMG:  07/14/2018 Birads 1 negative Colonoscopy: 06/2013recheck in 10 yrs BMD:   n/a TDaP:  10-26-13 Gardasil: no   reports that she has never smoked. She has never used smokeless tobacco. She reports current alcohol use of about 1.0 standard drinks of alcohol per week. She reports that she does not use drugs. She is a Pensions consultant. Able to work from home with [place] [place], likes working from home. 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   Past Medical History:  Diagnosis Date  . Abnormal Pap smear of cervix 11/97   LEEP, CIN III  . Acid reflux   . MVP (mitral valve prolapse)   . Vertigo     Past Surgical History:  Procedure Laterality Date  . CERVICAL BIOPSY  W/ LOOP ELECTRODE EXCISION  11/97    CIN III  . COLPOSCOPY  11/97   CIN III  . HYSTEROSCOPY  02/2003   benign polyps    Current Outpatient Medications  Medication Sig Dispense Refill  . albuterol (VENTOLIN HFA) 108 (90 Base) MCG/ACT inhaler Inhale into the lungs.    . Collagen Hydrolysate, Bovine, POWD     . fluticasone (FLONASE) 50 MCG/ACT nasal spray Place into the nose.    Marland Kitchen glucosamine-chondroitin 500-400 MG tablet Take 1 tablet by mouth daily.    . Ibuprofen (ADVIL) 200 MG CAPS Take 1 capsule by mouth.    . Multiple Vitamin (MULTI-VITAMINS) TABS Take by mouth.    . Probiotic Product (PROBIOTIC-10 ULTIMATE) CAPS Take 1 tablet by mouth daily.     No current facility-administered medications for this visit.     Family History  Problem Relation Age of Onset  . Osteoporosis Mother   . Diabetes Sister   . Osteoporosis Maternal Grandmother   . Breast cancer Neg Hx     Review of Systems  Constitutional:       Weight gain  HENT: Negative.   Eyes: Negative.   Respiratory: Negative.   Cardiovascular: Negative.   Gastrointestinal: Negative.   Endocrine: Negative.   Genitourinary: Positive for urgency.       Night urination  Incontinence   Musculoskeletal: Negative.   Skin: Negative.   Allergic/Immunologic: Negative.  Neurological: Negative.   Hematological: Negative.   Psychiatric/Behavioral:       Insomnia     Exam:   BP 108/68 (BP Location: Right Arm, Patient Position: Sitting, Cuff Size: Normal)   Pulse 72   Temp 98.4 F (36.9 C) (Oral)   Resp 12   Ht 5\' 2"  (1.575 m)   Wt 142 lb (64.4 kg)   LMP 08/06/2016   BMI 25.97 kg/m   Weight change: @WEIGHTCHANGE @ Height:   Height: 5\' 2"  (157.5 cm)  Ht Readings from Last 3 Encounters:  03/18/19 5\' 2"  (1.575 m)  11/28/17 5' 2.5" (1.588 m)  11/14/16 5' 2.5" (1.588 m)    General appearance: alert, cooperative and appears stated age Head: Normocephalic, without obvious abnormality, atraumatic Neck: no adenopathy, supple, symmetrical, trachea midline and  thyroid normal to inspection and palpation Lungs: clear to auscultation bilaterally Cardiovascular: regular rate and rhythm Breasts: normal appearance, no masses or tenderness Abdomen: soft, non-tender; non distended,  no masses,  no organomegaly Extremities: extremities normal, atraumatic, no cyanosis or edema Skin: Skin color, texture, turgor normal. No rashes or lesions Lymph nodes: Cervical, supraclavicular, and axillary nodes normal. No abnormal inguinal nodes palpated Neurologic: Grossly normal   Pelvic: External genitalia:  no lesions              Urethra:  normal appearing urethra with no masses, tenderness or lesions              Bartholins and Skenes: normal                 Vagina: atrophic appearing vagina with normal color and discharge, no lesions              Cervix: no lesions               Bimanual Exam:  Uterus:  normal size, contour, position, consistency, mobility, non-tender              Adnexa: no mass, fullness, tenderness               Rectovaginal: Confirms               Anus:  normal sphincter tone, no lesions  Chaperone was present for exam.  A:  Well Woman with normal exam  Urinary incontinence without sensory awareness  P:   No pap this year  Discussed breast self exam  Discussed calcium and vit D intake  Labs with primary  Urine for ua, c&s  Referral to Urology  Colonoscopy UTD  Mammogram in 11/20

## 2019-04-21 DIAGNOSIS — R1013 Epigastric pain: Secondary | ICD-10-CM | POA: Diagnosis not present

## 2019-04-21 DIAGNOSIS — R0789 Other chest pain: Secondary | ICD-10-CM | POA: Diagnosis not present

## 2019-04-22 ENCOUNTER — Other Ambulatory Visit: Payer: Self-pay | Admitting: Internal Medicine

## 2019-04-22 DIAGNOSIS — R0789 Other chest pain: Secondary | ICD-10-CM

## 2019-04-22 DIAGNOSIS — R1013 Epigastric pain: Secondary | ICD-10-CM

## 2019-04-27 ENCOUNTER — Ambulatory Visit: Payer: BLUE CROSS/BLUE SHIELD | Admitting: Urology

## 2019-04-29 DIAGNOSIS — R0789 Other chest pain: Secondary | ICD-10-CM | POA: Diagnosis not present

## 2019-04-30 ENCOUNTER — Ambulatory Visit
Admission: RE | Admit: 2019-04-30 | Discharge: 2019-04-30 | Disposition: A | Discharge status: Home / Self Care | Payer: BC Managed Care – PPO | Source: Ambulatory Visit | Attending: Internal Medicine | Admitting: Internal Medicine

## 2019-04-30 ENCOUNTER — Other Ambulatory Visit: Payer: Self-pay

## 2019-04-30 DIAGNOSIS — R1013 Epigastric pain: Secondary | ICD-10-CM | POA: Diagnosis not present

## 2019-04-30 DIAGNOSIS — R0789 Other chest pain: Secondary | ICD-10-CM | POA: Diagnosis not present

## 2019-04-30 DIAGNOSIS — R1310 Dysphagia, unspecified: Secondary | ICD-10-CM | POA: Diagnosis not present

## 2019-05-08 DIAGNOSIS — R1013 Epigastric pain: Secondary | ICD-10-CM | POA: Diagnosis not present

## 2019-06-16 DIAGNOSIS — Z23 Encounter for immunization: Secondary | ICD-10-CM | POA: Diagnosis not present

## 2019-07-06 ENCOUNTER — Other Ambulatory Visit: Payer: Self-pay | Admitting: Obstetrics and Gynecology

## 2019-07-06 ENCOUNTER — Other Ambulatory Visit: Payer: Self-pay | Admitting: Internal Medicine

## 2019-07-06 DIAGNOSIS — D229 Melanocytic nevi, unspecified: Secondary | ICD-10-CM | POA: Diagnosis not present

## 2019-07-06 DIAGNOSIS — Z1283 Encounter for screening for malignant neoplasm of skin: Secondary | ICD-10-CM | POA: Diagnosis not present

## 2019-07-06 DIAGNOSIS — D225 Melanocytic nevi of trunk: Secondary | ICD-10-CM | POA: Diagnosis not present

## 2019-07-06 DIAGNOSIS — Z1231 Encounter for screening mammogram for malignant neoplasm of breast: Secondary | ICD-10-CM

## 2019-07-06 DIAGNOSIS — D1800 Hemangioma unspecified site: Secondary | ICD-10-CM | POA: Diagnosis not present

## 2019-07-21 ENCOUNTER — Ambulatory Visit
Admission: RE | Admit: 2019-07-21 | Discharge: 2019-07-21 | Disposition: A | Discharge status: Home / Self Care | Payer: BC Managed Care – PPO | Source: Ambulatory Visit | Attending: Obstetrics and Gynecology | Admitting: Obstetrics and Gynecology

## 2019-07-21 ENCOUNTER — Other Ambulatory Visit: Payer: Self-pay

## 2019-07-21 DIAGNOSIS — Z1231 Encounter for screening mammogram for malignant neoplasm of breast: Secondary | ICD-10-CM | POA: Insufficient documentation

## 2019-09-03 ENCOUNTER — Encounter

## 2020-02-28 ENCOUNTER — Encounter: Payer: Self-pay | Admitting: Obstetrics and Gynecology

## 2020-02-29 ENCOUNTER — Telehealth: Payer: Self-pay | Admitting: Obstetrics and Gynecology

## 2020-02-29 NOTE — Telephone Encounter (Signed)
Mad[facility]. My annual appointment isn't until the end of July. For the past few months when having sexual intercourse, there's some sort of blockage that even my husband can feel...like something is in the way. I don't know if my bladder has fallen or if it's something else. There's no pain. Should I come in to have it checked out before my appointment at the end of July or maybe try to get my primary physician to refer me to a urologist? I've seen one before in [place] that I could visit again.

## 2020-02-29 NOTE — Telephone Encounter (Signed)
AEX 03/30/20 H/O urinary incontinence  Spoke with pt. Pt states having a "blockage"/ possible prolapse of either bladder or uterus that she has noticed in the past 2 months.  Her husband now noticing it with intercourse. Pt denies any abd pain/cramps , UTI sx, no seen bulge, no vaginal bleeding. Pt states has had increase in urinary frequency, but has urinary incontinence and has seen urologist in [place] in past.  Pt states wears panty liners for incontinence.  Pt states exercises regularly and can hear " water sloshing" around when doing stomach exercises. Denies no change in BM.   Advised pt to have OV for further evaluation and not wait for AEX to discuss. Pt agreeable. Pt scheduled with Dr Oscar La on 6/30 at 130 pm. Pt verbalized understanding and is agreeable to date and time of appt. CPS neg.   Routing to Dr Oscar La for review.  Encounter closed.

## 2020-03-02 ENCOUNTER — Ambulatory Visit (INDEPENDENT_AMBULATORY_CARE_PROVIDER_SITE_OTHER): Payer: BC Managed Care – PPO | Admitting: Obstetrics and Gynecology

## 2020-03-02 ENCOUNTER — Other Ambulatory Visit: Payer: Self-pay

## 2020-03-02 ENCOUNTER — Encounter: Payer: Self-pay | Admitting: Obstetrics and Gynecology

## 2020-03-02 VITALS — BP 130/64 | HR 62 | Temp 99.5°F | Ht 62.75 in | Wt 138.0 lb

## 2020-03-02 DIAGNOSIS — N952 Postmenopausal atrophic vaginitis: Secondary | ICD-10-CM | POA: Diagnosis not present

## 2020-03-02 NOTE — Progress Notes (Signed)
GYNECOLOGY  VISIT   HPI: 59 y.o.   Married White or Caucasian Not Hispanic or Latino  female   [phone] with Patient's last menstrual period was 08/06/2016.   here for possible prolapse. She states that when she has sex her husband feels that her can not insert himself over the last 2 months. Husband has had some ED, he is taking medication for the ED which is helping. No pain with sex, no bulge. No vaginal dryness.   She has a h/o small urinary leakage without sensory awareness. Has been wearing a mini pad for the last 2 years. Just notices that her pad is damp. Can wear one pad all day. Not significantly different. Not aware of GSI or urge incontinence. Normal BM's.    GYNECOLOGIC HISTORY: Patient's last menstrual period was 08/06/2016. Contraception:PMP Menopausal hormone therapy: none         OB History    Gravida  2   Para  2   Term  2   Preterm  0   AB  0   Living  2     SAB  0   TAB  0   Ectopic  0   Multiple  0   Live Births  2              Patient Active Problem List   Diagnosis Date Noted   History of migraine headaches 10/01/2014   History of abnormal cervical Pap smear 10/26/2013   Urge incontinence of urine 10/26/2013    Past Medical History:  Diagnosis Date   Abnormal Pap smear of cervix 11/97   LEEP, CIN III   Acid reflux    MVP (mitral valve prolapse)    Vertigo     Past Surgical History:  Procedure Laterality Date   CERVICAL BIOPSY  W/ LOOP ELECTRODE EXCISION  11/97   CIN III   COLPOSCOPY  11/97   CIN III   HYSTEROSCOPY  02/2003   benign polyps    Current Outpatient Medications  Medication Sig Dispense Refill   glucosamine-chondroitin 500-400 MG tablet Take 1 tablet by mouth daily.     Ibuprofen (ADVIL) 200 MG CAPS Take 1 capsule by mouth.     Multiple Vitamin (MULTI-VITAMINS) TABS Take by mouth.     Probiotic Product (PROBIOTIC-10 ULTIMATE) CAPS Take 1 tablet by mouth daily.     doxycycline (ORACEA) 40 MG  capsule Take by mouth.     No current facility-administered medications for this visit.     ALLERGIES: Patient has no known allergies.  Family History  Problem Relation Age of Onset   Osteoporosis Mother    Diabetes Sister    Osteoporosis Maternal Grandmother    Breast cancer Neg Hx     Social History   Socioeconomic History   Marital status: Married    Spouse name: Not on file   Number of children: 2   Years of education: Not on file   Highest education level: Not on file  Occupational History   Not on file  Tobacco Use   Smoking status: Former Smoker   Smokeless tobacco: Never Used  Building services engineer Use: Never used  Substance and Sexual Activity   Alcohol use: Yes    Alcohol/week: 1.0 standard drink    Types: 1 Glasses of wine per week    Comment: occ   Drug use: No   Sexual activity: Yes    Partners: Male    Birth control/protection: Surgical  Comment: vasectomy  Other Topics Concern   Not on file  Social History Narrative   Not on file   Social Determinants of Health   Financial Resource Strain:    Difficulty of Paying Living Expenses:   Food Insecurity:    Worried About Programme researcher, broadcasting/film/video in the Last Year:    Barista in the Last Year:   Transportation Needs:    Freight forwarder (Medical):    Lack of Transportation (Non-Medical):   Physical Activity:    Days of Exercise per Week:    Minutes of Exercise per Session:   Stress:    Feeling of Stress :   Social Connections:    Frequency of Communication with Friends and Family:    Frequency of Social Gatherings with Friends and Family:    Attends Religious Services:    Active Member of Clubs or Organizations:    Attends Engineer, structural:    Marital Status:   Intimate Partner Violence:    Fear of Current or Ex-Partner:    Emotionally Abused:    Physically Abused:    Sexually Abused:     Review of Systems  All other systems  reviewed and are negative.   PHYSICAL EXAMINATION:    BP 130/64    Pulse 62    Temp 99.5 F (37.5 C)    Ht 5' 2.75" (1.594 m)    Wt 138 lb (62.6 kg)    LMP 08/06/2016    SpO2 98%    BMI 24.64 kg/m     General appearance: alert, cooperative and appears stated age  Pelvic: External genitalia:  no lesions              Urethra:  normal appearing urethra with no masses, tenderness or lesions              Bartholins and Skenes: normal                 Vagina: normal appearing vagina with normal color and discharge, no lesions. She has mild vaginal atrophy, grade one cystocele with valsalva (examined supine and standing), easily able to insert a pederson speculum, easily able to insert 2 fingers vaginally without discomfort.               Cervix: no lesions              Bimanual Exam:  Uterus:  normal size, contour, position, consistency, mobility, non-tender              Adnexa: no mass, fullness, tenderness               Chaperone was present for exam.  ASSESSMENT Some difficulty with penile penetration in the last 2 months. Normal exam. Suspect it is a combination of vaginal dryness (not uncomfortable) and mild ED    PLAN Recommend she try a lubricant, try different positions.

## 2020-03-02 NOTE — Patient Instructions (Signed)
Atrophic Vaginitis  Atrophic vaginitis is a condition in which the tissues that line the vagina become dry and thin. This condition is most common in women who have stopped having regular menstrual periods (are in menopause). This usually starts when a woman is 45-58 years old. That is the time when a woman's estrogen levels begin to drop (decrease). Estrogen is a female hormone. It helps to keep the tissues of the vagina moist. It stimulates the vagina to produce a clear fluid that lubricates the vagina for sexual intercourse. This fluid also protects the vagina from infection. Lack of estrogen can cause the lining of the vagina to get thinner and dryer. The vagina may also shrink in size. It may become less elastic. Atrophic vaginitis tends to get worse over time as a woman's estrogen level drops. What are the causes? This condition is caused by the normal drop in estrogen that happens around the time of menopause. What increases the risk? Certain conditions or situations may lower a woman's estrogen level, leading to a higher risk for atrophic vaginitis. You are more likely to develop this condition if:  You are taking medicines that block estrogen.  You have had your ovaries removed.  You are being treated for cancer with X-ray (radiation) or medicines (chemotherapy).  You have given birth or are breastfeeding.  You are older than age 50.  You smoke. What are the signs or symptoms? Symptoms of this condition include:  Pain, soreness, or bleeding during sexual intercourse (dyspareunia).  Vaginal burning, irritation, or itching.  Pain or bleeding when a speculum is used in a vaginal exam (pelvic exam).  Having burning pain when passing urine.  Vaginal discharge that is brown or yellow. In some cases, there are no symptoms. How is this diagnosed? This condition is diagnosed by taking a medical history and doing a physical exam. This will include a pelvic exam that checks the  vaginal tissues. Though rare, you may also have other tests, including:  A urine test.  A test that checks the acid balance in your vagina (acid balance test). How is this treated? Treatment for this condition depends on how severe your symptoms are. Treatment may include:  Using an over-the-counter vaginal lubricant before sex.  Using a long-acting vaginal moisturizer.  Using low-dose vaginal estrogen for moderate to severe symptoms that do not respond to other treatments. Options include creams, tablets, and inserts (vaginal rings). Before you use a vaginal estrogen, tell your health care provider if you have a history of: ? Breast cancer. ? Endometrial cancer. ? Blood clots. If you are not sexually active and your symptoms are very mild, you may not need treatment. Follow these instructions at home: Medicines  Take over-the-counter and prescription medicines only as told by your health care provider. Do not use herbal or alternative medicines unless your health care provider says that you can.  Use over-the-counter creams, lubricants, or moisturizers for dryness only as directed by your health care provider. General instructions  If your atrophic vaginitis is caused by menopause, discuss all of your menopause symptoms and treatment options with your health care provider.  Do not douche.  Do not use products that can make your vagina dry. These include: ? Scented feminine sprays. ? Scented tampons. ? Scented soaps.  Vaginal intercourse can help to improve blood flow and elasticity of vaginal tissue. If it hurts to have sex, try using a lubricant or moisturizer just before having intercourse. Contact a health care provider if:    Your discharge looks different than normal.  Your vagina has an unusual smell.  You have new symptoms.  Your symptoms do not improve with treatment.  Your symptoms get worse. Summary  Atrophic vaginitis is a condition in which the tissues that  line the vagina become dry and thin. It is most common in women who have stopped having regular menstrual periods (are in menopause).  Treatment options include using vaginal lubricants and low-dose vaginal estrogen.  Contact a health care provider if your vagina has an unusual smell, or if your symptoms get worse or do not improve after treatment. This information is not intended to replace advice given to you by your health care provider. Make sure you discuss any questions you have with your health care provider. Document Revised: 08/02/2017 Document Reviewed: 05/16/2017 Elsevier Patient Education  2020 Elsevier Inc.  

## 2020-03-28 NOTE — Progress Notes (Signed)
59 y.o. G40P2002 Married White or Caucasian Not Hispanic or Latino female here for annual exam.  Some vaginal dryness, lubrication helps. Mild ED.  No vaginal bleeding. No bowel or bladder c/o.     Patient's last menstrual period was 08/06/2016.          Sexually active: Yes.    The current method of family planning is post menopausal status.    Exercising: Yes.    daily workouts Smoker:  no  Health Maintenance: Pap:  11/28/2017 WNL NEG HPV, 10-27-14 Neg:Neg HR HPV, 10-26-13 Neg History of abnormal Pap:  Yes, Hx LEEP 1997 CIN III MMG:  07/21/19 density B Bi-rads 1 neg  BMD:   None  Colonoscopy: 06/2013recheck in 10 yrs  TDaP:  10/26/13  Gardasil: NA   reports that she has quit smoking. She has never used smokeless tobacco. She reports current alcohol use of about 1.0 standard drink of alcohol per week. She reports that she does not use drugs. 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 49 year old granddaughter is being evaluated for precocious puberty.   Past Medical History:  Diagnosis Date  . Abnormal Pap smear of cervix 11/97   LEEP, CIN III  . Acid reflux   . MVP (mitral valve prolapse)   . Vertigo     Past Surgical History:  Procedure Laterality Date  . CERVICAL BIOPSY  W/ LOOP ELECTRODE EXCISION  11/97   CIN III  . COLPOSCOPY  11/97   CIN III  . HYSTEROSCOPY  02/2003   benign polyps    Current Outpatient Medications  Medication Sig Dispense Refill  . ALPRAZolam (XANAX) 0.5 MG tablet Take 0.5 mg by mouth daily as needed. Prior to dental appointments    . doxycycline (ORACEA) 40 MG capsule Take by mouth.    Marland Kitchen glucosamine-chondroitin 500-400 MG tablet Take 1 tablet by mouth daily.    . Multiple Vitamin (MULTI-VITAMINS) TABS Take by mouth.    . Probiotic Product (PROBIOTIC-10 ULTIMATE) CAPS Take 1 tablet by mouth daily.     No current facility-administered medications for this visit.    Family History  Problem Relation  Age of Onset  . Osteoporosis Mother   . Diabetes Sister   . Osteoporosis Maternal Grandmother   . Breast cancer Neg Hx     Review of Systems  Constitutional: Negative.   HENT: Negative.   Eyes: Negative.   Respiratory: Negative.   Cardiovascular: Negative.   Gastrointestinal: Negative.   Endocrine: Negative.   Genitourinary: Negative.   Musculoskeletal: Negative.   Skin: Negative.   Allergic/Immunologic: Negative.   Neurological: Negative.   Hematological: Negative.   Psychiatric/Behavioral: Negative.   C/O intermittent issues with restless legs at night  Exam:   BP 108/70 (BP Location: Right Arm, Patient Position: Sitting, Cuff Size: Normal)   Pulse 64   Resp 12   Ht 5' 2.5" (1.588 m)   Wt 136 lb (61.7 kg)   LMP 08/06/2016   BMI 24.48 kg/m   Weight change: @WEIGHTCHANGE @ Height:   Height: 5' 2.5" (158.8 cm)  Ht Readings from Last 3 Encounters:  03/30/20 5' 2.5" (1.588 m)  03/02/20 5' 2.75" (1.594 m)  03/18/19 5\' 2"  (1.575 m)    General appearance: alert, cooperative and appears stated age Head: Normocephalic, without obvious abnormality, atraumatic Neck: no adenopathy, supple, symmetrical, trachea midline and thyroid normal to inspection and palpation Lungs: clear to auscultation bilaterally Cardiovascular: regular rate and rhythm  Breasts: normal appearance, no masses or tenderness Abdomen: soft, non-tender; non distended,  no masses,  no organomegaly Extremities: extremities normal, atraumatic, no cyanosis or edema Skin: Skin color, texture, turgor normal. No rashes or lesions Lymph nodes: Cervical, supraclavicular, and axillary nodes normal. No abnormal inguinal nodes palpated Neurologic: Grossly normal   Pelvic: External genitalia:  no lesions              Urethra:  normal appearing urethra with no masses, tenderness or lesions              Bartholins and Skenes: normal                 Vagina: mildly atrophic appearing vagina with normal color and  discharge, no lesions              Cervix: no lesions               Bimanual Exam:  Uterus:  normal size, contour, position, consistency, mobility, non-tender              Adnexa: no mass, fullness, tenderness               Rectovaginal: Confirms               Anus:  normal sphincter tone, no lesions  Zenovia Jordan chaperoned for the exam.  A:  Well Woman with normal exam  Mild vaginal atrophy, helped with lubrication  P:   No pap this year  Discussed breast self exam  Discussed calcium and vit D intake  Labs with primary  Mammogram in 11/21  Colonoscopy UTD

## 2020-03-29 ENCOUNTER — Encounter: Payer: Self-pay | Admitting: Dermatology

## 2020-03-30 ENCOUNTER — Encounter: Payer: Self-pay | Admitting: Obstetrics and Gynecology

## 2020-03-30 ENCOUNTER — Other Ambulatory Visit: Payer: Self-pay

## 2020-03-30 ENCOUNTER — Ambulatory Visit (INDEPENDENT_AMBULATORY_CARE_PROVIDER_SITE_OTHER): Payer: BC Managed Care – PPO | Admitting: Obstetrics and Gynecology

## 2020-03-30 VITALS — BP 108/70 | HR 64 | Resp 12 | Ht 62.5 in | Wt 136.0 lb

## 2020-03-30 DIAGNOSIS — Z01419 Encounter for gynecological examination (general) (routine) without abnormal findings: Secondary | ICD-10-CM | POA: Diagnosis not present

## 2020-03-30 NOTE — Patient Instructions (Addendum)
Restless Legs Syndrome Restless legs syndrome is a condition that causes uncomfortable feelings or sensations in the legs, especially while sitting or lying down. The sensations usually cause an overwhelming urge to move the legs. The arms can also sometimes be affected. The condition can range from mild to severe. The symptoms often interfere with a person's ability to sleep. What are the causes? The cause of this condition is not known. What increases the risk? The following factors may make you more likely to develop this condition:  Being older than 58.  Pregnancy.  Being a woman. In general, the condition is more common in women than in men.  A family history of the condition.  Having iron deficiency.  Overuse of caffeine, nicotine, or alcohol.  Certain medical conditions, such as kidney disease, Parkinson's disease, or nerve damage.  Certain medicines, such as those for high blood pressure, nausea, colds, allergies, depression, and some heart conditions. What are the signs or symptoms? The main symptom of this condition is uncomfortable sensations in the legs, such as:  Pulling.  Tingling.  Prickling.  Throbbing.  Crawling.  Burning. Usually, the sensations:  Affect both sides of the body.  Are worse when you sit or lie down.  Are worse at night. These may wake you up or make it difficult to fall asleep.  Make you have a strong urge to move your legs.  Are temporarily relieved by moving your legs. The arms can also be affected, but this is rare. People who have this condition often have tiredness during the day because of their lack of sleep at night. How is this diagnosed? This condition may be diagnosed based on:  Your symptoms.  Blood tests. In some cases, you may be monitored in a sleep lab by a specialist (a sleep study). This can detect any disruptions in your sleep. How is this treated? This condition is treated by managing the symptoms. This may  include:  Lifestyle changes, such as exercising, using relaxation techniques, and avoiding caffeine, alcohol, or tobacco.  Medicines. Anti-seizure medicines may be tried first. Follow these instructions at home:     General instructions  Take over-the-counter and prescription medicines only as told by your health care provider.  Use methods to help relieve the uncomfortable sensations, such as: ? Massaging your legs. ? Walking or stretching. ? Taking a cold or hot bath.  Keep all follow-up visits as told by your health care provider. This is important. Lifestyle  Practice good sleep habits. For example, go to bed and get up at the same time every day. Most adults should get 7-9 hours of sleep each night.  Exercise regularly. Try to get at least 30 minutes of exercise most days of the week.  Practice ways of relaxing, such as yoga or meditation.  Avoid caffeine and alcohol.  Do not use any products that contain nicotine or tobacco, such as cigarettes and e-cigarettes. If you need help quitting, ask your health care provider. Contact a health care provider if:  Your symptoms get worse or they do not improve with treatment. Summary  Restless legs syndrome is a condition that causes uncomfortable feelings or sensations in the legs, especially while sitting or lying down.  The symptoms often interfere with a person's ability to sleep.  This condition is treated by managing the symptoms. You may need to make lifestyle changes or take medicines. This information is not intended to replace advice given to you by your health care provider. Make sure   you discuss any questions you have with your health care provider. Document Revised: 09/09/2017 Document Reviewed: 09/09/2017 Elsevier Patient Education  2020 [employer]   EXERCISE AND DIET:  We recommended that you start or continue a regular exercise program for good health. Regular exercise means any activity that makes your  heart beat faster and makes you sweat.  We recommend exercising at least 30 minutes per day at least 3 days a week, preferably 4 or 5.  We also recommend a diet low in fat and sugar.  Inactivity, poor dietary choices and obesity can cause diabetes, heart attack, stroke, and kidney damage, among others.    ALCOHOL AND SMOKING:  Women should limit their alcohol intake to no more than 7 drinks/beers/glasses of wine (combined, not each!) per week. Moderation of alcohol intake to this level decreases your risk of breast cancer and liver damage. And of course, no recreational drugs are part of a healthy lifestyle.  And absolutely no smoking or even second hand smoke. Most people know smoking can cause heart and lung diseases, but did you know it also contributes to weakening of your bones? Aging of your skin?  Yellowing of your teeth and nails?  CALCIUM AND VITAMIN D:  Adequate intake of calcium and Vitamin D are recommended.  The recommendations for exact amounts of these supplements seem to change often, but generally speaking 1,200 mg of calcium (between diet and supplement) and 800 units of Vitamin D per day seems prudent. Certain women may benefit from higher intake of Vitamin D.  If you are among these women, your doctor will have told you during your visit.    PAP SMEARS:  Pap smears, to check for cervical cancer or precancers,  have traditionally been done yearly, although recent scientific advances have shown that most women can have pap smears less often.  However, every woman still should have a physical exam from her gynecologist every year. It will include a breast check, inspection of the vulva and vagina to check for abnormal growths or skin changes, a visual exam of the cervix, and then an exam to evaluate the size and shape of the uterus and ovaries.  And after 58 years of age, a rectal exam is indicated to check for rectal cancers. We will also provide age appropriate advice regarding health  maintenance, like when you should have certain vaccines, screening for sexually transmitted diseases, bone density testing, colonoscopy, mammograms, etc.   MAMMOGRAMS:  All women over 16 years old should have a yearly mammogram. Many facilities now offer a "3D" mammogram, which may cost around $50 extra out of pocket. If possible,  we recommend you accept the option to have the 3D mammogram performed.  It both reduces the number of women who will be called back for extra views which then turn out to be normal, and it is better than the routine mammogram at detecting truly abnormal areas.    COLON CANCER SCREENING: Now recommend starting at age 69. At this time colonoscopy is not covered for routine screening until 50. There are take home tests that can be done between 45-49.   COLONOSCOPY:  Colonoscopy to screen for colon cancer is recommended for all women at age 63.  We know, you hate the idea of the prep.  We agree, BUT, having colon cancer and not knowing it is worse!!  Colon cancer so often starts as a polyp that can be seen and removed at colonscopy, which can quite literally save your  life!  And if your first colonoscopy is normal and you have no family history of colon cancer, most women don't have to have it again for 10 years.  Once every ten years, you can do something that may end up saving your life, right?  We will be happy to help you get it scheduled when you are ready.  Be sure to check your insurance coverage so you understand how much it will cost.  It may be covered as a preventative service at no cost, but you should check your particular policy.      Breast Self-Awareness Breast self-awareness means being familiar with how your breasts look and feel. It involves checking your breasts regularly and reporting any changes to your health care provider. Practicing breast self-awareness is important. A change in your breasts can be a sign of a serious medical problem. Being familiar with  how your breasts look and feel allows you to find any problems early, when treatment is more likely to be successful. All women should practice breast self-awareness, including women who have had breast implants. How to do a breast self-exam One way to learn what is normal for your breasts and whether your breasts are changing is to do a breast self-exam. To do a breast self-exam: Look for Changes  1. Remove all the clothing above your waist. 2. Stand in front of a mirror in a room with good lighting. 3. Put your hands on your hips. 4. Push your hands firmly downward. 5. Compare your breasts in the mirror. Look for differences between them (asymmetry), such as: ? Differences in shape. ? Differences in size. ? Puckers, dips, and bumps in one breast and not the other. 6. Look at each breast for changes in your skin, such as: ? Redness. ? Scaly areas. 7. Look for changes in your nipples, such as: ? Discharge. ? Bleeding. ? Dimpling. ? Redness. ? A change in position. Feel for Changes Carefully feel your breasts for lumps and changes. It is best to do this while lying on your back on the floor and again while sitting or standing in the shower or tub with soapy water on your skin. Feel each breast in the following way:  Place the arm on the side of the breast you are examining above your head.  Feel your breast with the other hand.  Start in the nipple area and make  inch (2 cm) overlapping circles to feel your breast. Use the pads of your three middle fingers to do this. Apply light pressure, then medium pressure, then firm pressure. The light pressure will allow you to feel the tissue closest to the skin. The medium pressure will allow you to feel the tissue that is a little deeper. The firm pressure will allow you to feel the tissue close to the ribs.  Continue the overlapping circles, moving downward over the breast until you feel your ribs below your breast.  Move one finger-width  toward the center of the body. Continue to use the  inch (2 cm) overlapping circles to feel your breast as you move slowly up toward your collarbone.  Continue the up and down exam using all three pressures until you reach your armpit.  Write Down What You Find  Write down what is normal for each breast and any changes that you find. Keep a written record with breast changes or normal findings for each breast. By writing this information down, you do not need to depend only on  memory for size, tenderness, or location. Write down where you are in your menstrual cycle, if you are still menstruating. If you are having trouble noticing differences in your breasts, do not get discouraged. With time you will become more familiar with the variations in your breasts and more comfortable with the exam. How often should I examine my breasts? Examine your breasts every month. If you are breastfeeding, the best time to examine your breasts is after a feeding or after using a breast pump. If you menstruate, the best time to examine your breasts is 5-7 days after your period is over. During your period, your breasts are lumpier, and it may be more difficult to notice changes. When should I see my health care provider? See your health care provider if you notice:  A change in shape or size of your breasts or nipples.  A change in the skin of your breast or nipples, such as a reddened or scaly area.  Unusual discharge from your nipples.  A lump or thick area that was not there before.  Pain in your breasts.  Anything that concerns you.

## 2020-04-06 ENCOUNTER — Ambulatory Visit: Payer: BC Managed Care – PPO | Admitting: Dermatology

## 2020-04-12 ENCOUNTER — Other Ambulatory Visit: Payer: Self-pay | Admitting: Dermatology

## 2020-06-02 ENCOUNTER — Encounter: Payer: Self-pay | Admitting: Dermatology

## 2020-06-13 ENCOUNTER — Other Ambulatory Visit: Payer: Self-pay | Admitting: Obstetrics and Gynecology

## 2020-06-13 DIAGNOSIS — Z1231 Encounter for screening mammogram for malignant neoplasm of breast: Secondary | ICD-10-CM

## 2020-07-04 ENCOUNTER — Encounter: Payer: BC Managed Care – PPO | Admitting: Dermatology

## 2020-07-21 ENCOUNTER — Ambulatory Visit
Admission: RE | Admit: 2020-07-21 | Discharge: 2020-07-21 | Disposition: A | Discharge status: Home / Self Care | Payer: BC Managed Care – PPO | Source: Ambulatory Visit | Attending: Obstetrics and Gynecology | Admitting: Obstetrics and Gynecology

## 2020-07-21 ENCOUNTER — Other Ambulatory Visit: Payer: Self-pay

## 2020-07-21 DIAGNOSIS — Z1231 Encounter for screening mammogram for malignant neoplasm of breast: Secondary | ICD-10-CM | POA: Insufficient documentation

## 2021-04-03 NOTE — Progress Notes (Signed)
60 y.o. G51P2002 Married White or Caucasian Not Hispanic or Latino female here for annual exam.  Still has occasional hot flashes, no night sweats. No vaginal bleeding. Sexually active, slight discomfort on the vaginal walls with intercourse.   Patient had covid in Feb. Patient states that she has has been fatigued more lately. Recent normal lab work, including TSH. She has mild sleep apnea, doesn't need CPAP.     Patient's last menstrual period was 08/06/2016.          Sexually active: Yes.    The current method of family planning is status post hysterectomy.    Exercising: Yes.     Weights, cardio 5 days a week  Smoker:  no  Health Maintenance: Pap:  11/28/2017 WNL NEG HPV 10-27-14 Neg:Neg HR HPV 10-26-13 Neg History of abnormal Pap:  Yes, Hx LEEP 1997 CIN III MMG:  07/25/20 density C Bi-rads 1 neg  BMD:   none  Colonoscopy: 02/2012 recheck in 10 yrs  TDaP:  10/26/13 Gardasil: n/a   reports that she has quit smoking. She has never used smokeless tobacco. She reports current alcohol use of about 1.0 standard drink of alcohol per week. 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  Past Medical History:  Diagnosis Date   Abnormal Pap smear of cervix 11/97   LEEP, CIN III   Acid reflux    MVP (mitral valve prolapse)    Vertigo     Past Surgical History:  Procedure Laterality Date   CERVICAL BIOPSY  W/ LOOP ELECTRODE EXCISION  11/97   CIN III   COLPOSCOPY  11/97   CIN III   HYSTEROSCOPY  02/2003   benign polyps    Current Outpatient Medications  Medication Sig Dispense Refill   ALPRAZolam (XANAX) 0.5 MG tablet Take 0.5 mg by mouth daily as needed. Prior to dental appointments     glucosamine-chondroitin 500-400 MG tablet Take 1 tablet by mouth daily.     Multiple Vitamin (MULTI-VITAMINS) TABS Take by mouth.     ORACEA 40 MG capsule TAKE ONE CAPSULE BY MOUTH DAILY WITH FOOD AND DRINK 30  capsule 2   Probiotic Product (PROBIOTIC-10 ULTIMATE) CAPS Take 1 tablet by mouth daily.     No current facility-administered medications for this visit.    Family History  Problem Relation Age of Onset   Osteoporosis Mother    Diabetes Sister    Osteoporosis Maternal Grandmother    Breast cancer Neg Hx     Review of Systems  Constitutional:  Positive for fatigue.  All other systems reviewed and are negative.  Exam:   BP 104/62   Pulse (!) 58   Ht 5' 2.75" (1.594 m)   Wt 138 lb 6.4 oz (62.8 kg)   LMP 08/06/2016   SpO2 96%   BMI 24.71 kg/m   Weight change: @WEIGHTCHANGE @ Height:   Height: 5' 2.75" (159.4 cm)  Ht Readings from Last 3 Encounters:  04/05/21 5' 2.75" (1.594 m)  03/30/20 5' 2.5" (1.588 m)  03/02/20 5' 2.75" (1.594 m)    General appearance: alert, cooperative and appears stated age Head: Normocephalic, without obvious abnormality, atraumatic Neck: no adenopathy, supple, symmetrical, trachea midline and thyroid normal to inspection and palpation Lungs: clear to auscultation bilaterally Cardiovascular: regular rate and rhythm Breasts: normal appearance, no masses or tenderness Abdomen: soft, non-tender; non distended,  no masses,  no organomegaly Extremities: extremities normal,  atraumatic, no cyanosis or edema Skin: Skin color, texture, turgor normal. No rashes or lesions Lymph nodes: Cervical, supraclavicular, and axillary nodes normal. No abnormal inguinal nodes palpated Neurologic: Grossly normal   Pelvic: External genitalia:  no lesions              Urethra:  normal appearing urethra with no masses, tenderness or lesions              Bartholins and Skenes: normal                 Vagina: normal appearing vagina with normal color and discharge, no lesions              Cervix: no lesions, stenotic               Bimanual Exam:  Uterus:  normal size, contour, position, consistency, mobility, non-tender              Adnexa: no mass, fullness, tenderness                Rectovaginal: Confirms               Anus:  normal sphincter tone, no lesions  Carolynn Serve chaperoned for the exam.  1. Well woman exam Labs UTD Mammogram due in 11/22 Colonoscopy next year Discussed breast self exam Discussed calcium and vit D intake  2. Screening for cervical cancer - Cytology - PAP  3. Vaginal atrophy with mild dyspareunia.  Discussed the option of vaginal estrogen. She would like to try it.  - Estradiol 10 MCG TABS vaginal tablet; Place one tablet vaginally qhs x 1 week, then change to 2 x a week at hs.  Dispense: 24 tablet; Refill: 4

## 2021-04-05 ENCOUNTER — Encounter: Payer: Self-pay | Admitting: Obstetrics and Gynecology

## 2021-04-05 ENCOUNTER — Other Ambulatory Visit: Payer: Self-pay

## 2021-04-05 ENCOUNTER — Ambulatory Visit (INDEPENDENT_AMBULATORY_CARE_PROVIDER_SITE_OTHER): Payer: BC Managed Care – PPO | Admitting: Obstetrics and Gynecology

## 2021-04-05 ENCOUNTER — Other Ambulatory Visit (HOSPITAL_COMMUNITY)
Admission: RE | Admit: 2021-04-05 | Discharge: 2021-04-05 | Disposition: A | Discharge status: Home / Self Care | Payer: BC Managed Care – PPO | Source: Ambulatory Visit | Attending: Obstetrics and Gynecology | Admitting: Obstetrics and Gynecology

## 2021-04-05 VITALS — BP 104/62 | HR 58 | Ht 62.75 in | Wt 138.4 lb

## 2021-04-05 DIAGNOSIS — N952 Postmenopausal atrophic vaginitis: Secondary | ICD-10-CM | POA: Diagnosis not present

## 2021-04-05 DIAGNOSIS — Z124 Encounter for screening for malignant neoplasm of cervix: Secondary | ICD-10-CM | POA: Insufficient documentation

## 2021-04-05 DIAGNOSIS — Z01419 Encounter for gynecological examination (general) (routine) without abnormal findings: Secondary | ICD-10-CM | POA: Diagnosis not present

## 2021-04-05 MED ORDER — ESTRADIOL 10 MCG VA TABS: ORAL_TABLET | VAGINAL | 4 refills | Status: DC

## 2021-04-05 NOTE — Patient Instructions (Signed)

## 2021-04-06 LAB — CYTOLOGY - PAP
Comment: NEGATIVE
Diagnosis: NEGATIVE
High risk HPV: NEGATIVE

## 2021-04-17 ENCOUNTER — Encounter: Payer: Self-pay | Admitting: Obstetrics and Gynecology

## 2021-06-07 ENCOUNTER — Other Ambulatory Visit: Payer: Self-pay | Admitting: Obstetrics and Gynecology

## 2021-06-07 DIAGNOSIS — Z1231 Encounter for screening mammogram for malignant neoplasm of breast: Secondary | ICD-10-CM

## 2021-07-11 ENCOUNTER — Other Ambulatory Visit: Payer: Self-pay | Admitting: Internal Medicine

## 2021-07-11 DIAGNOSIS — R5383 Other fatigue: Secondary | ICD-10-CM

## 2021-07-11 DIAGNOSIS — R5381 Other malaise: Secondary | ICD-10-CM

## 2021-07-11 DIAGNOSIS — R1011 Right upper quadrant pain: Secondary | ICD-10-CM

## 2021-07-21 ENCOUNTER — Ambulatory Visit: Payer: BC Managed Care – PPO

## 2021-07-24 ENCOUNTER — Other Ambulatory Visit: Payer: Self-pay

## 2021-07-24 ENCOUNTER — Ambulatory Visit
Admission: RE | Admit: 2021-07-24 | Discharge: 2021-07-24 | Disposition: A | Discharge status: Home / Self Care | Payer: BC Managed Care – PPO | Source: Ambulatory Visit | Attending: Obstetrics and Gynecology | Admitting: Obstetrics and Gynecology

## 2021-07-24 DIAGNOSIS — Z1231 Encounter for screening mammogram for malignant neoplasm of breast: Secondary | ICD-10-CM | POA: Diagnosis present

## 2021-08-01 ENCOUNTER — Ambulatory Visit
Admission: RE | Admit: 2021-08-01 | Discharge: 2021-08-01 | Disposition: A | Discharge status: Home / Self Care | Payer: BC Managed Care – PPO | Source: Ambulatory Visit | Attending: Internal Medicine | Admitting: Internal Medicine

## 2021-08-01 DIAGNOSIS — R5381 Other malaise: Secondary | ICD-10-CM | POA: Diagnosis present

## 2021-08-01 DIAGNOSIS — R1011 Right upper quadrant pain: Secondary | ICD-10-CM | POA: Diagnosis not present

## 2021-08-01 DIAGNOSIS — R5383 Other fatigue: Secondary | ICD-10-CM | POA: Insufficient documentation

## 2021-12-10 ENCOUNTER — Encounter: Payer: Self-pay | Admitting: Obstetrics and Gynecology

## 2022-03-23 ENCOUNTER — Encounter: Payer: Self-pay | Admitting: Obstetrics and Gynecology

## 2022-03-23 MED ORDER — FLUCONAZOLE 150 MG PO TABS: ORAL_TABLET | ORAL | 0 refills | Status: DC

## 2022-03-23 MED ORDER — TINIDAZOLE 500 MG PO TABS: ORAL_TABLET | ORAL | 0 refills | Status: DC

## 2022-03-23 NOTE — Telephone Encounter (Signed)
Dr.Lavoie replied "Given her travels, you can send Tinidazole 2 tab BID x 2 days and Fluconazole 150 mg 1 tab every other day x 3. "

## 2022-04-03 NOTE — Progress Notes (Signed)
61 y.o. G34P2002 Married White or Caucasian Not Hispanic or Latino female here for annual exam.  No vaginal bleeding.  Last year she was prescribed vaginal estrogen for atrophy and mild dyspareunia. She tried it, didn't notice an improvement, so stopped it. Sexually active, mild discomfort, but it seems a little better. She is using a lubricant.  No bowel or bladder changes. Occasional urge incontinence, tolerable.     Patient's last menstrual period was 08/06/2016.          Sexually active: No.  The current method of family planning is post menopausal status.    Exercising: Yes.     [employer] class  Smoker:  no  Health Maintenance: Pap:  04/05/21 WNL Hr HPV Neg  11/28/2017 WNL NEG HPV 10-27-14 Neg:Neg HR HPV History of abnormal Pap:  Yes, Hx LEEP 1997 CIN III MMG:  07/24/21 density B Bi-rads 1 neg  BMD:  11/27/21 at [facility] -2.5, primary prescribed fosamax, she hasn't started it yet.  Colonoscopy: 10/26/13, scheduled  TDaP:  10/26/13  Gardasil: n/a   reports that she has quit smoking. She has never used smokeless tobacco. She reports current alcohol use of about 1.0 standard drink of alcohol per week. She reports that she does not use drugs. 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  Past Medical History:  Diagnosis Date   Abnormal Pap smear of cervix 11/97   LEEP, CIN III   Acid reflux    MVP (mitral valve prolapse)    Vertigo     Past Surgical History:  Procedure Laterality Date   CERVICAL BIOPSY  W/ LOOP ELECTRODE EXCISION  11/97   CIN III   COLPOSCOPY  11/97   CIN III   HYSTEROSCOPY  02/2003   benign polyps    Current Outpatient Medications  Medication Sig Dispense Refill   ALPRAZolam (XANAX) 0.5 MG tablet Take 0.5 mg by mouth daily as needed. Prior to dental appointments     glucosamine-chondroitin 500-400 MG tablet Take 1 tablet by mouth daily.     Multiple Vitamin (MULTI-VITAMINS) TABS Take by mouth.     ORACEA 40 MG capsule  TAKE ONE CAPSULE BY MOUTH DAILY WITH FOOD AND DRINK 30 capsule 2   Probiotic Product (PROBIOTIC-10 ULTIMATE) CAPS Take 1 tablet by mouth daily.     alendronate (FOSAMAX) 70 MG tablet Take by mouth. (Patient not taking: Reported on 04/11/2022)     No current facility-administered medications for this visit.    Family History  Problem Relation Age of Onset   Osteoporosis Mother    Diabetes Sister    Osteoporosis Maternal Grandmother    Breast cancer Neg Hx     Review of Systems  All other systems reviewed and are negative.   Exam:   BP 110/70   Pulse 71   Ht 5' 2.6" (1.59 m)   Wt 137 lb (62.1 kg)   LMP 08/06/2016   SpO2 100%   BMI 24.58 kg/m   Weight change: @WEIGHTCHANGE @ Height:   Height: 5' 2.6" (159 cm)  Ht Readings from Last 3 Encounters:  04/11/22 5' 2.6" (1.59 m)  04/05/21 5' 2.75" (1.594 m)  03/30/20 5' 2.5" (1.588 m)    General appearance: alert, cooperative and appears stated age Head: Normocephalic, without obvious abnormality, atraumatic Neck: no adenopathy, supple, symmetrical, trachea midline and thyroid normal to inspection and palpation Lungs: clear to auscultation bilaterally Cardiovascular: regular rate and rhythm Breasts: normal appearance, no masses  or tenderness Abdomen: soft, non-tender; non distended,  no masses,  no organomegaly Extremities: extremities normal, atraumatic, no cyanosis or edema Skin: Skin color, texture, turgor normal. No rashes or lesions Lymph nodes: Cervical, supraclavicular, and axillary nodes normal. No abnormal inguinal nodes palpated Neurologic: Grossly normal   Pelvic: External genitalia:  no lesions              Urethra:  normal appearing urethra with no masses, tenderness or lesions              Bartholins and Skenes: normal                 Vagina: normal appearing vagina with normal color and discharge, no lesions              Cervix: no lesions               Bimanual Exam:  Uterus:  normal size, contour, position,  consistency, mobility, non-tender              Adnexa: no mass, fullness, tenderness               Rectovaginal: Confirms               Anus:  normal sphincter tone, no lesions  Carolynn Serve, CMA chaperoned for the exam.  1. Well woman exam Discussed breast self exam Labs with primary Mammogram this fall Colonoscopy scheduled No pap this year  2. History of osteoporosis Will start Fosamax Discussed calcium, vit d and exercise

## 2022-04-11 ENCOUNTER — Encounter: Payer: Self-pay | Admitting: Obstetrics and Gynecology

## 2022-04-11 ENCOUNTER — Ambulatory Visit (INDEPENDENT_AMBULATORY_CARE_PROVIDER_SITE_OTHER): Payer: BC Managed Care – PPO | Admitting: Obstetrics and Gynecology

## 2022-04-11 VITALS — BP 110/70 | HR 71 | Ht 62.6 in | Wt 137.0 lb

## 2022-04-11 DIAGNOSIS — Z01419 Encounter for gynecological examination (general) (routine) without abnormal findings: Secondary | ICD-10-CM

## 2022-04-11 DIAGNOSIS — Z8739 Personal history of other diseases of the musculoskeletal system and connective tissue: Secondary | ICD-10-CM

## 2022-04-11 NOTE — Patient Instructions (Signed)

## 2022-04-19 ENCOUNTER — Ambulatory Visit: Payer: BC Managed Care – PPO | Admitting: Obstetrics and Gynecology

## 2022-04-19 ENCOUNTER — Encounter: Payer: Self-pay | Admitting: Obstetrics and Gynecology

## 2022-04-19 VITALS — BP 110/68 | HR 77 | Wt 136.0 lb

## 2022-04-19 DIAGNOSIS — B3731 Acute candidiasis of vulva and vagina: Secondary | ICD-10-CM

## 2022-04-19 DIAGNOSIS — N949 Unspecified condition associated with female genital organs and menstrual cycle: Secondary | ICD-10-CM

## 2022-04-19 LAB — WET PREP FOR TRICH, YEAST, CLUE

## 2022-04-19 MED ORDER — FLUCONAZOLE 150 MG PO TABS: 150.0000 mg | ORAL_TABLET | Freq: Once | ORAL | 0 refills | Status: AC

## 2022-04-19 MED ORDER — BETAMETHASONE VALERATE 0.1 % EX OINT: 1.0000 | TOPICAL_OINTMENT | Freq: Two times a day (BID) | CUTANEOUS | 0 refills | Status: DC

## 2022-04-19 NOTE — Progress Notes (Signed)
GYNECOLOGY  VISIT   HPI: 61 y.o.   Married White or Caucasian Not Hispanic or Latino  female   [phone] with Patient's last menstrual period was 08/06/2016.   here for vaginal burning.  The burning is at the opening of the vagina. No itching, no discharge, no odor. Patient denies dysuria. She is voiding frequently, but normal amounts, no urgency to void.  She has previously tried vaginal estrogen for mild dyspareunia and it didn't help.   GYNECOLOGIC HISTORY: Patient's last menstrual period was 08/06/2016. Contraception:pmp Menopausal hormone therapy: none         OB History     Gravida  2   Para  2   Term  2   Preterm  0   AB  0   Living  2      SAB  0   IAB  0   Ectopic  0   Multiple  0   Live Births  2              Patient Active Problem List   Diagnosis Date Noted   Sleep apnea 10/28/2017   History of migraine headaches 10/01/2014   History of abnormal cervical Pap smear 10/26/2013   Urge incontinence of urine 10/26/2013    Past Medical History:  Diagnosis Date   Abnormal Pap smear of cervix 11/97   LEEP, CIN III   Acid reflux    MVP (mitral valve prolapse)    Vertigo     Past Surgical History:  Procedure Laterality Date   CERVICAL BIOPSY  W/ LOOP ELECTRODE EXCISION  11/97   CIN III   COLPOSCOPY  11/97   CIN III   HYSTEROSCOPY  02/2003   benign polyps    Current Outpatient Medications  Medication Sig Dispense Refill   alendronate (FOSAMAX) 70 MG tablet Take by mouth.     ALPRAZolam (XANAX) 0.5 MG tablet Take 0.5 mg by mouth daily as needed. Prior to dental appointments     glucosamine-chondroitin 500-400 MG tablet Take 1 tablet by mouth daily.     Multiple Vitamin (MULTI-VITAMINS) TABS Take by mouth.     ORACEA 40 MG capsule TAKE ONE CAPSULE BY MOUTH DAILY WITH FOOD AND DRINK 30 capsule 2   Probiotic Product (PROBIOTIC-10 ULTIMATE) CAPS Take 1 tablet by mouth daily.     No current facility-administered medications for this visit.      ALLERGIES: Patient has no known allergies.  Family History  Problem Relation Age of Onset   Osteoporosis Mother    Diabetes Sister    Osteoporosis Maternal Grandmother    Breast cancer Neg Hx     Social History   Socioeconomic History   Marital status: Married    Spouse name: Not on file   Number of children: 2   Years of education: Not on file   Highest education level: Not on file  Occupational History   Not on file  Tobacco Use   Smoking status: Former   Smokeless tobacco: Never  Vaping Use   Vaping Use: Never used  Substance and Sexual Activity   Alcohol use: Yes    Alcohol/week: 1.0 standard drink of alcohol    Types: 1 Glasses of wine per week    Comment: occ   Drug use: No   Sexual activity: Yes    Partners: Male    Birth control/protection: Surgical    Comment: vasectomy  Other Topics Concern   Not on file  Social History Narrative  Not on file   Social Determinants of Health   Financial Resource Strain: Not on file  Food Insecurity: Not on file  Transportation Needs: Not on file  Physical Activity: Not on file  Stress: Not on file  Social Connections: Not on file  Intimate Partner Violence: Not on file    Review of Systems  All other systems reviewed and are negative.   PHYSICAL EXAMINATION:    BP 110/68   Pulse 77   Wt 136 lb (61.7 kg)   LMP 08/06/2016   SpO2 99%   BMI 24.40 kg/m     General appearance: alert, cooperative and appears stated age  Pelvic: External genitalia:  no lesions, no erythema, atrophic              Urethra:  normal appearing urethra with no masses, tenderness or lesions              Bartholins and Skenes: normal                 Vagina: atrophic appearing vagina with normal color, no discharge, no lesions              Cervix: no lesions               Chaperone was present for exam.  1. Vaginal burning - WET PREP FOR TRICH, YEAST, CLUE  2. Yeast vaginitis - fluconazole (DIFLUCAN) 150 MG tablet; Take 1  tablet (150 mg total) by mouth once for 1 dose. Take one tablet.  Repeat in 72 hours if symptoms are not completely resolved.  Dispense: 2 tablet; Refill: 0 - betamethasone valerate ointment (VALISONE) 0.1 %; Apply 1 Application topically 2 (two) times daily. Use prn for up to 2 weeks  Dispense: 30 g; Refill: 0

## 2022-04-19 NOTE — Patient Instructions (Signed)

## 2022-04-19 NOTE — Telephone Encounter (Signed)
You can add her on to my schedule today at 3:45, or she can go to urgent care

## 2022-04-19 NOTE — Telephone Encounter (Signed)
Patient informed, Debarah Crape added to schedule.

## 2022-04-20 NOTE — Telephone Encounter (Signed)
Dr.Jertson patient  

## 2022-05-11 ENCOUNTER — Other Ambulatory Visit: Payer: Self-pay | Admitting: Internal Medicine

## 2022-05-11 DIAGNOSIS — Z1231 Encounter for screening mammogram for malignant neoplasm of breast: Secondary | ICD-10-CM

## 2022-07-30 ENCOUNTER — Ambulatory Visit
Admission: RE | Admit: 2022-07-30 | Discharge: 2022-07-30 | Disposition: A | Discharge status: Home / Self Care | Payer: BC Managed Care – PPO | Source: Ambulatory Visit | Attending: Internal Medicine | Admitting: Internal Medicine

## 2022-07-30 DIAGNOSIS — Z1231 Encounter for screening mammogram for malignant neoplasm of breast: Secondary | ICD-10-CM | POA: Insufficient documentation

## 2023-04-17 ENCOUNTER — Ambulatory Visit: Payer: BC Managed Care – PPO | Admitting: Obstetrics and Gynecology

## 2023-04-17 ENCOUNTER — Ambulatory Visit (INDEPENDENT_AMBULATORY_CARE_PROVIDER_SITE_OTHER): Payer: BC Managed Care – PPO | Admitting: Radiology

## 2023-04-17 ENCOUNTER — Encounter: Payer: Self-pay | Admitting: Radiology

## 2023-04-17 VITALS — BP 100/62 | Ht 62.25 in | Wt 145.0 lb

## 2023-04-17 DIAGNOSIS — N958 Other specified menopausal and perimenopausal disorders: Secondary | ICD-10-CM | POA: Diagnosis not present

## 2023-04-17 DIAGNOSIS — Z01419 Encounter for gynecological examination (general) (routine) without abnormal findings: Secondary | ICD-10-CM

## 2023-04-17 DIAGNOSIS — Z8739 Personal history of other diseases of the musculoskeletal system and connective tissue: Secondary | ICD-10-CM | POA: Diagnosis not present

## 2023-04-17 MED ORDER — IMVEXXY MAINTENANCE PACK 10 MCG VA INST
1.0000 | VAGINAL_INSERT | VAGINAL | 11 refills | Status: AC
Start: 2023-04-18 — End: ?

## 2023-04-17 NOTE — Progress Notes (Signed)
   Madison Evans [birth year] [id number]   History: Postmenopausal 62 y.o. presents for annual exam. Dyspareunia, no improvement with estrace cream so she stopped last year. No new gyn concerns.    Gynecologic History Postmenopausal Last Pap: 2022. Results were: normal Last mammogram: 2023. Results were: normal Last colonoscopy: 2023 diverticulitis DEXA:2022 osteoporosis   Obstetric History OB History  Gravida Para Term Preterm AB Living  2 2 2  0 0 2  SAB IAB Ectopic Multiple Live Births  0 0 0 0 2    # Outcome Date GA Lbr Len/2nd Weight Sex Type Anes PTL Lv  2 Term 58    M Vag-Spont   LIV  1 Term 62    F Vag-Spont   LIV     The following portions of the patient's history were reviewed and updated as appropriate: allergies, current medications, past family history, past medical history, past social history, past surgical history, and problem list.  Review of Systems Pertinent items noted in HPI and remainder of comprehensive ROS otherwise negative.  Past medical history, past surgical history, family history and social history were all reviewed and documented in the EPIC chart.  Exam:  Vitals:   04/17/23 0828  BP: 100/62  Weight: 145 lb (65.8 kg)  Height: 5' 2.25" (1.581 m)   Body mass index is 26.31 kg/m.  General appearance:  Normal Thyroid:  Symmetrical, normal in size, without palpable masses or nodularity. Respiratory  Auscultation:  Clear without wheezing or rhonchi Cardiovascular  Auscultation:  Regular rate, without rubs, murmurs or gallops  Edema/varicosities:  Not grossly evident Abdominal  Soft,nontender, without masses, guarding or rebound.  Liver/spleen:  No organomegaly noted  Hernia:  None appreciated  Skin  Inspection:  Grossly normal Breasts: Examined lying and sitting.   Right: Without masses, retractions, nipple discharge or axillary adenopathy.   Left: Without masses, retractions, nipple discharge or axillary adenopathy. Genitourinary    Inguinal/mons:  Normal without inguinal adenopathy  External genitalia:  Normal appearing vulva with no masses, tenderness, or lesions  BUS/Urethra/Skene's glands:  Normal  Vagina:  Normal appearing with normal color and discharge, no lesions. Atrophy: moderate  Cervix:  Normal appearing without discharge or lesions  Uterus:  Normal in size, shape and contour.  Midline and mobile, nontender  Adnexa/parametria:     Rt: Normal in size, without masses or tenderness.   Lt: Normal in size, without masses or tenderness.  Anus and perineum: Normal    Raynelle Fanning, CMA present for exam  Assessment/Plan:   1. Well woman exam with routine gynecological exam Pap due 2027  2. History of osteoporosis Managed by endocrinology Vit D3 5000iu with K2 nightly  3. Genitourinary syndrome of menopause No improvement with estrogen cream, will try imvexxy - Estradiol (IMVEXXY MAINTENANCE PACK) 10 MCG INST; Place 1 tablet vaginally 2 (two) times a week.  Dispense: 8 each; Refill: 11    Discussed SBE, colonoscopy and DEXA screening as directed. Recommend of exercise weekly, including weight bearing exercise. Encouraged the use of seatbelts and sunscreen.  Return in 1 year for annual or sooner prn.  Jhoanna Heyde B WHNP-BC, 9:00 AM 04/17/2023

## 2023-06-21 ENCOUNTER — Other Ambulatory Visit: Payer: Self-pay | Admitting: Internal Medicine

## 2023-06-21 DIAGNOSIS — Z1231 Encounter for screening mammogram for malignant neoplasm of breast: Secondary | ICD-10-CM

## 2023-08-06 ENCOUNTER — Ambulatory Visit
Admission: RE | Admit: 2023-08-06 | Discharge: 2023-08-06 | Disposition: A | Discharge status: Home / Self Care | Payer: BC Managed Care – PPO | Source: Ambulatory Visit | Attending: Internal Medicine | Admitting: Internal Medicine

## 2023-08-06 DIAGNOSIS — Z1231 Encounter for screening mammogram for malignant neoplasm of breast: Secondary | ICD-10-CM | POA: Insufficient documentation

## 2024-04-21 ENCOUNTER — Ambulatory Visit: Payer: BC Managed Care – PPO | Admitting: Radiology

## 2024-05-14 ENCOUNTER — Other Ambulatory Visit: Payer: Self-pay | Admitting: Internal Medicine

## 2024-05-14 DIAGNOSIS — R140 Abdominal distension (gaseous): Secondary | ICD-10-CM

## 2024-05-15 ENCOUNTER — Ambulatory Visit
Admission: RE | Admit: 2024-05-15 | Discharge: 2024-05-15 | Disposition: A | Discharge status: Home / Self Care | Source: Ambulatory Visit | Attending: Internal Medicine | Admitting: Internal Medicine

## 2024-05-15 DIAGNOSIS — R140 Abdominal distension (gaseous): Secondary | ICD-10-CM | POA: Insufficient documentation

## 2024-06-02 ENCOUNTER — Ambulatory Visit (INDEPENDENT_AMBULATORY_CARE_PROVIDER_SITE_OTHER): Admitting: Radiology

## 2024-06-02 ENCOUNTER — Encounter: Payer: Self-pay | Admitting: Radiology

## 2024-06-02 VITALS — BP 102/68 | HR 86 | Ht 62.75 in | Wt 142.0 lb

## 2024-06-02 DIAGNOSIS — Z01419 Encounter for gynecological examination (general) (routine) without abnormal findings: Secondary | ICD-10-CM

## 2024-06-02 DIAGNOSIS — Z1331 Encounter for screening for depression: Secondary | ICD-10-CM | POA: Diagnosis not present

## 2024-06-02 DIAGNOSIS — N959 Unspecified menopausal and perimenopausal disorder: Secondary | ICD-10-CM

## 2024-06-02 MED ORDER — PROGESTERONE MICRONIZED 100 MG PO CAPS: 100.0000 mg | ORAL_CAPSULE | Freq: Every day | ORAL | 4 refills | Status: AC

## 2024-06-02 MED ORDER — ESTRADIOL 0.025 MG/24HR TD PTTW: 1.0000 | MEDICATED_PATCH | TRANSDERMAL | 4 refills | Status: DC

## 2024-06-02 NOTE — Progress Notes (Signed)
   Madison Evans [birth year] [id number]   History: Postmenopausal 63 y.o. presents for annual exam. Interested in HRT for health benefits. Exercises multiple times a week, having trouble building muscle mass. Having more bloating and breast tenderness. Hx of osteoporosis, current dexa shows osteopenia.    Gynecologic History Postmenopausal Last Pap: 2022. Results were: normal Last mammogram: 12/24. Results were: normal Last colonoscopy: 2023   Obstetric History OB History  Gravida Para Term Preterm AB Living  2 2 2  0 0 2  SAB IAB Ectopic Multiple Live Births  0 0 0 0 2    # Outcome Date GA Lbr Len/2nd Weight Sex Type Anes PTL Lv  2 Term 81    M Vag-Spont   LIV  1 Term 80    F Vag-Spont   LIV       06/02/2024   11:32 AM  Depression screen PHQ 2/9  Decreased Interest 0  Down, Depressed, Hopeless 0  PHQ - 2 Score 0     The following portions of the patient's history were reviewed and updated as appropriate: allergies, current medications, past family history, past medical history, past social history, past surgical history, and problem list.  Review of Systems Pertinent items noted in HPI and remainder of comprehensive ROS otherwise negative.  Past medical history, past surgical history, family history and social history were all reviewed and documented in the EPIC chart.  Exam:  Vitals:   06/02/24 1130  BP: 102/68  Pulse: 86  SpO2: 96%  Weight: 142 lb (64.4 kg)  Height: 5' 2.75 (1.594 m)   Body mass index is 25.36 kg/m.  General appearance:  Normal Thyroid:  Symmetrical, normal in size, without palpable masses or nodularity. Respiratory  Auscultation:  Clear without wheezing or rhonchi Cardiovascular  Auscultation:  Regular rate, without rubs, murmurs or gallops  Edema/varicosities:  Not grossly evident Abdominal  Soft,nontender, without masses, guarding or rebound.  Liver/spleen:  No organomegaly noted  Hernia:  None appreciated  Skin  Inspection:   Grossly normal Breasts: Examined lying and sitting.   Right: Without masses, retractions, nipple discharge or axillary adenopathy.   Left: Without masses, retractions, nipple discharge or axillary adenopathy. Genitourinary   Inguinal/mons:  Normal without inguinal adenopathy  External genitalia:  Normal appearing vulva with no masses, tenderness, or lesions  BUS/Urethra/Skene's glands:  Normal  Vagina:  Normal appearing with normal color and discharge, no lesions. Atrophy: mild   Cervix:  Normal appearing without discharge or lesions  Uterus:  Normal in size, shape and contour.  Midline and mobile, nontender  Adnexa/parametria:     Rt: Normal in size, without masses or tenderness.   Lt: Normal in size, without masses or tenderness.  Anus and perineum: Normal    Darice Hoit, CMA present for exam  Assessment/Plan:   1. Well woman exam with routine gynecological exam (Primary) Pap 2027 Yearly mammo Labs with PCP  2. Menopausal disorder Risks and benefits reviewed with patient. Would like to try to build muscle and bone - estradiol  (VIVELLE -DOT) 0.025 MG/24HR; Place 1 patch onto the skin 2 (two) times a week.  Dispense: 24 patch; Refill: 4 - progesterone (PROMETRIUM) 100 MG capsule; Take 1 capsule (100 mg total) by mouth daily.  Dispense: 90 capsule; Refill: 4  3. Depression screening    Return in 1 year for annual or sooner prn.  Rector Devonshire B WHNP-BC, 12:01 PM 06/02/2024

## 2024-06-30 ENCOUNTER — Other Ambulatory Visit: Payer: Self-pay | Admitting: Radiology

## 2024-06-30 ENCOUNTER — Other Ambulatory Visit: Payer: Self-pay | Admitting: Internal Medicine

## 2024-06-30 DIAGNOSIS — Z1231 Encounter for screening mammogram for malignant neoplasm of breast: Secondary | ICD-10-CM

## 2024-06-30 DIAGNOSIS — N959 Unspecified menopausal and perimenopausal disorder: Secondary | ICD-10-CM

## 2024-06-30 NOTE — Telephone Encounter (Signed)
 Med refill request:    estradiol  (VIVELLE -DOT) 0.025 MG/24HR Start:  06/04/2024, Normal - Ordered On: 06/02/2024  Disp:   24 patches Refills:  4  Last AEX:  06/02/24 Next AEX:  06/03/25 Last MMG (if hormonal med):  08/06/23 Refill authorized? Please Advise.

## 2024-08-13 ENCOUNTER — Ambulatory Visit
Admission: RE | Admit: 2024-08-13 | Discharge: 2024-08-13 | Disposition: A | Discharge status: Home / Self Care | Source: Ambulatory Visit | Attending: Internal Medicine | Admitting: Internal Medicine

## 2024-08-13 DIAGNOSIS — Z1231 Encounter for screening mammogram for malignant neoplasm of breast: Secondary | ICD-10-CM | POA: Insufficient documentation

## 2025-06-03 ENCOUNTER — Ambulatory Visit: Admitting: Radiology
